# Patient Record
Sex: Female | Born: 1965 | Race: Black or African American | Hispanic: No | Marital: Married | Smoking: Never smoker
Health system: Southern US, Community
[De-identification: ages and names within clinical notes are randomized; demographics above are authoritative.]

## PROBLEM LIST (undated history)

## (undated) DIAGNOSIS — E269 Hyperaldosteronism, unspecified: Secondary | ICD-10-CM

## (undated) DIAGNOSIS — I10 Essential (primary) hypertension: Secondary | ICD-10-CM

## (undated) DIAGNOSIS — Z8669 Personal history of other diseases of the nervous system and sense organs: Secondary | ICD-10-CM

## (undated) DIAGNOSIS — D573 Sickle-cell trait: Secondary | ICD-10-CM

## (undated) HISTORY — DX: Essential (primary) hypertension: I10

## (undated) HISTORY — PX: WISDOM TOOTH EXTRACTION: SHX21

## (undated) HISTORY — DX: Hyperaldosteronism, unspecified: E26.9

## (undated) HISTORY — DX: Sickle-cell trait: D57.3

## (undated) HISTORY — DX: Personal history of other diseases of the nervous system and sense organs: Z86.69

---

## 2002-06-17 ENCOUNTER — Other Ambulatory Visit: Admission: RE | Admit: 2002-06-17 | Discharge: 2002-06-17 | Payer: Self-pay | Admitting: Obstetrics and Gynecology

## 2002-06-24 ENCOUNTER — Encounter: Payer: Self-pay | Admitting: Obstetrics and Gynecology

## 2002-06-24 ENCOUNTER — Ambulatory Visit (HOSPITAL_COMMUNITY): Admission: RE | Admit: 2002-06-24 | Discharge: 2002-06-24 | Payer: Self-pay | Admitting: Obstetrics and Gynecology

## 2004-09-28 ENCOUNTER — Encounter: Admission: RE | Admit: 2004-09-28 | Discharge: 2004-09-28 | Payer: Self-pay | Admitting: Allergy and Immunology

## 2004-12-18 ENCOUNTER — Ambulatory Visit (HOSPITAL_COMMUNITY): Admission: RE | Admit: 2004-12-18 | Discharge: 2004-12-18 | Payer: Self-pay | Admitting: Family Medicine

## 2006-01-15 ENCOUNTER — Ambulatory Visit (HOSPITAL_COMMUNITY): Admission: RE | Admit: 2006-01-15 | Discharge: 2006-01-15 | Payer: Self-pay | Admitting: Family Medicine

## 2006-02-06 ENCOUNTER — Ambulatory Visit (HOSPITAL_COMMUNITY): Admission: RE | Admit: 2006-02-06 | Discharge: 2006-02-06 | Payer: Self-pay | Admitting: Family Medicine

## 2007-04-07 ENCOUNTER — Ambulatory Visit (HOSPITAL_BASED_OUTPATIENT_CLINIC_OR_DEPARTMENT_OTHER): Admission: RE | Admit: 2007-04-07 | Discharge: 2007-04-07 | Payer: Self-pay | Admitting: Family Medicine

## 2007-04-12 ENCOUNTER — Ambulatory Visit: Payer: Self-pay | Admitting: Internal Medicine

## 2008-09-20 ENCOUNTER — Ambulatory Visit (HOSPITAL_COMMUNITY): Admission: RE | Admit: 2008-09-20 | Discharge: 2008-09-20 | Payer: Self-pay | Admitting: Family Medicine

## 2009-04-21 ENCOUNTER — Emergency Department (HOSPITAL_COMMUNITY): Admission: EM | Admit: 2009-04-21 | Discharge: 2009-04-21 | Payer: Self-pay | Admitting: Emergency Medicine

## 2010-01-04 ENCOUNTER — Ambulatory Visit (HOSPITAL_COMMUNITY)
Admission: RE | Admit: 2010-01-04 | Discharge: 2010-01-04 | Payer: Self-pay | Source: Home / Self Care | Attending: Obstetrics and Gynecology | Admitting: Obstetrics and Gynecology

## 2010-04-11 LAB — URINE MICROSCOPIC-ADD ON

## 2010-04-11 LAB — URINALYSIS, ROUTINE W REFLEX MICROSCOPIC
Glucose, UA: NEGATIVE mg/dL
Ketones, ur: 15 mg/dL — AB
Nitrite: POSITIVE — AB
Protein, ur: 100 mg/dL — AB
Specific Gravity, Urine: 1.015 (ref 1.005–1.030)
Urobilinogen, UA: 1 mg/dL (ref 0.0–1.0)
pH: 5.5 (ref 5.0–8.0)

## 2010-04-11 LAB — POCT PREGNANCY, URINE: Preg Test, Ur: NEGATIVE

## 2010-06-05 NOTE — Procedures (Signed)
NAMEMarland Kitchen  Destiny Alvarez, Destiny Alvarez NO.:  [id number]   MEDICAL RECORD NO.:  [id number]          PATIENT TYPE:  OUT   LOCATION:  SLEEP CENTER                 FACILITY:  [facility]   PHYSICIAN:  Clinton D. Maple Hudson, MD, FCCP, FACPDATE OF BIRTH:  05-17-64   DATE OF STUDY:  04/07/2007                            NOCTURNAL POLYSOMNOGRAM   REFERRING PHYSICIAN:  Betti D. Pecola Leisure, M.D.   REFERRING PHYSICIAN:  Dr. Leilani Able.   INDICATION FOR STUDY:  Hypersomnia with sleep apnea.   EPWORTH SLEEPINESS SCORE:  5/24   BMI 30.6, weight 245 pounds, height 75 inches (please verify).   HOME MEDICATIONS:  Charted and reviewed.   SLEEP ARCHITECTURE:  Total sleep time 334 minutes with sleep efficiency  91.3%; stage I was 5.2%, stage II 68.3%, stage III absent, REM 26.5% of  total sleep time.  Sleep latency 16 minutes, REM latency 54 minutes.  Awake after sleep onset 15.5 minutes.  Arousal index 9.7.  No bedtime  medication was taken.   RESPIRATORY DATA:  Apnea-hypopnea index (AHI) 1.1, respiratory  disturbance index (RDI) 5.4.  A total of 6 events were scored including  1 central apnea and 5 hypopneas with most events occurring while supine.  REM AHI 1.4.  There were insufficient events to qualify for CPAP  titration by split protocol.   OXYGEN DATA:  Mild to moderate snoring with oxygen desaturation to a  nadir of 91%.  Mean oxygen saturation through the study was 95.7% on  room air.   CARDIAC DATA:  Sinus rhythm with occasional PAC.   MOVEMENT/PARASOMNIA:  No significant movement disturbance, no bathroom  trips.   IMPRESSIONS/RECOMMENDATIONS:  1. Insignificant respiratory disturbance with occasional respiratory      events; apnea-hypopnea index 1.1 per hour is normal (normal range 0-      5).  Respiratory disturbance index of 5.4 is borderline, qualifying      for definition as minimal obstructive sleep apnea/hypopnea, but is      unlikely to be clinically significant.  Mild to moderate  snoring      with normal oxygenation, desaturating to a nadir of 91%.  2. Sleep architecture unremarkable.  The percentage of time spent in      rapid eye movement is a little higher than we usually see in an      adult in an unfamiliar environment.  If there are profound      complaints of daytime somnolence, consider exploring the      possibility of a primary disorder of excessive sleepiness such as      idiopathic      or narcolepsy.  If this is a real concern, consider returning for a      daytime sleep study (multiple sleep latency tests) to objectively      assess the daytime sleepiness.      Clinton D. Maple Hudson, MD, [facility] FACP  Diplomate, Biomedical engineer of Sleep Medicine  Electronically Signed     CDY/MEDQ  D:  04/12/2007 08:24:55  T:  04/12/2007 08:47:20  Job:  [id number]

## 2011-03-07 ENCOUNTER — Ambulatory Visit (INDEPENDENT_AMBULATORY_CARE_PROVIDER_SITE_OTHER): Payer: BC Managed Care – PPO | Admitting: Family Medicine

## 2011-03-07 VITALS — BP 152/90 | HR 86 | Temp 98.6°F | Resp 20 | Ht 74.5 in | Wt 244.0 lb

## 2011-03-07 DIAGNOSIS — J069 Acute upper respiratory infection, unspecified: Secondary | ICD-10-CM

## 2011-03-07 DIAGNOSIS — R05 Cough: Secondary | ICD-10-CM

## 2011-03-07 MED ORDER — BENZONATATE 100 MG PO CAPS: 100.0000 mg | ORAL_CAPSULE | Freq: Three times a day (TID) | ORAL | Status: AC | PRN

## 2011-03-07 MED ORDER — AZITHROMYCIN 250 MG PO TABS: ORAL_TABLET | ORAL | Status: AC

## 2011-03-07 NOTE — Patient Instructions (Signed)
Mucinex or Mucinex DM as needed for cough.  Fluids, rest as needed, and if not improving in next few days, fill antibiotic prescription.  Ok to take probiotics. Return to the clinic or go to the nearest emergency room if any of your symptoms worsen or new symptoms occur.

## 2011-03-07 NOTE — Progress Notes (Signed)
  Subjective:    Patient ID: Destiny Alvarez, female    DOB: [birth year], 46 y.o.   MRN: [id number]  HPI Destiny Alvarez is a 46 y.o. female Started 5-6 days ago - tickle in throat, nasal congestion 4 days ago, cough and chest congestion past 2 days.  Post tussive emesis x2.  No other N/V.  Coughing worst sx now.  Headache with cough.  No known recent sick contacts, but has been working out more recently at gym.   No flu shot this year.  No fevers.  Hx of pna and bronchitis in past. Tx: otc coricidin HBP, echinacea an goldenseal, vit C    Review of Systems  Constitutional: Positive for activity change. Negative for fever and chills.       Some fatigue  W/ illness  HENT: Positive for congestion, sore throat, rhinorrhea and postnasal drip. Negative for hearing loss, ear pain, sneezing, sinus pressure and ear discharge.   Respiratory: Positive for cough. Negative for shortness of breath and wheezing.        Productive - light green.  Neurological: Positive for headaches.       Objective:   Physical Exam  Constitutional: She is oriented to person, place, and time. She appears well-developed and well-nourished.  HENT:  Head: Normocephalic and atraumatic.  Right Ear: External ear normal.  Left Ear: External ear normal.  Nose: Nose normal.  Mouth/Throat: Oropharynx is clear and moist. No oropharyngeal exudate.  Eyes: Conjunctivae are normal. Pupils are equal, round, and reactive to light.  Neck: Neck supple.  Cardiovascular: Normal rate, regular rhythm, normal heart sounds and intact distal pulses.   Pulmonary/Chest: Effort normal and breath sounds normal. No respiratory distress. She has no wheezes. She has no rales. She exhibits no tenderness.  Lymphadenopathy:    She has no cervical adenopathy.  Neurological: She is alert and oriented to person, place, and time.  Skin: Skin is warm and dry. No rash noted.  Psychiatric: She has a normal mood and affect. Her behavior is normal.         Assessment & Plan:   1. Cough   2. URI (upper respiratory infection)    Suspect viral etiology still at this point.  Mucinex, saline ns, +/- tessalon. Zpak #1 if not improving in few days.   Return to the clinic or go to the nearest emergency room if symptoms worsen or new symptoms occur.

## 2011-07-24 DIAGNOSIS — G43009 Migraine without aura, not intractable, without status migrainosus: Secondary | ICD-10-CM | POA: Insufficient documentation

## 2011-09-05 DIAGNOSIS — E269 Hyperaldosteronism, unspecified: Secondary | ICD-10-CM | POA: Insufficient documentation

## 2011-09-05 DIAGNOSIS — D573 Sickle-cell trait: Secondary | ICD-10-CM | POA: Insufficient documentation

## 2011-09-05 DIAGNOSIS — I10 Essential (primary) hypertension: Secondary | ICD-10-CM | POA: Insufficient documentation

## 2013-09-13 ENCOUNTER — Emergency Department
Admission: EM | Admit: 2013-09-13 | Discharge: 2013-09-13 | Disposition: A | Discharge status: Home / Self Care | Payer: BC Managed Care – PPO | Attending: Emergency Medicine | Admitting: Emergency Medicine

## 2013-09-13 ENCOUNTER — Emergency Department: Payer: BC Managed Care – PPO

## 2013-09-13 DIAGNOSIS — E269 Hyperaldosteronism, unspecified: Secondary | ICD-10-CM | POA: Insufficient documentation

## 2013-09-13 DIAGNOSIS — S92911D Unspecified fracture of right toe(s), subsequent encounter for fracture with routine healing: Secondary | ICD-10-CM

## 2013-09-13 DIAGNOSIS — S92919A Unspecified fracture of unspecified toe(s), initial encounter for closed fracture: Secondary | ICD-10-CM | POA: Insufficient documentation

## 2013-09-13 DIAGNOSIS — R1012 Left upper quadrant pain: Secondary | ICD-10-CM | POA: Insufficient documentation

## 2013-09-13 DIAGNOSIS — I10 Essential (primary) hypertension: Secondary | ICD-10-CM | POA: Insufficient documentation

## 2013-09-13 LAB — URINALYSIS, REFLEX TO MICROSCOPIC EXAM IF INDICATED
Bilirubin, UA: NEGATIVE
Blood, UA: NEGATIVE
Glucose, UA: NEGATIVE
Ketones UA: NEGATIVE
Nitrite, UA: NEGATIVE
Specific Gravity UA: 1.014 (ref 1.001–1.035)
Urine pH: 6.5 (ref 5.0–8.0)
Urobilinogen, UA: NORMAL mg/dL

## 2013-09-13 LAB — POCT PREGNANCY TEST, URINE HCG: POCT Pregnancy HCG Test, UR: NEGATIVE

## 2013-09-13 NOTE — ED Provider Notes (Signed)
Physician/Midlevel provider first contact with patient: 09/13/13 1632          [facility] EMERGENCY DEPARTMENT H&P         CLINICAL SUMMARY          Diagnosis:    .     Final diagnoses:   Left upper quadrant pain   Toe fracture, right, with routine healing, subsequent encounter         MDM Notes:    MDM, Differential Diagnosis (not completely inclusive) and Plan: LUQ/left flank pain, intermittent, currently pain free.  Possible kidney stone, pyelo, mass.  Will check UA and non con CT.  Also past history of toe fx with continued pain, will recheck xray           Disposition:      ED Disposition     Discharge [facility] discharge to home/self care.    Condition at disposition: Stable            New Prescriptions    No medications on file                 CLINICAL INFORMATION        HPI:      Chief Complaint: Back Pain; Toe Pain; and Abdominal Pain  .    Alexis Whitehead is a 48 y.o. female      History obtained from: Patient    Symptoms have worsened in past 24-48 hours: yes    Location: LUQ and bilateral lower back  Quality: sharp  Severity: mild  Duration: 1-2 months  Timing: intermittent  Context: N/A  Modifying Factors: exacerbated with "crouching" while driving  Associated signs or symptoms: intermittent episodes of N/V where she will have "violent" emesis 4-5 times    PMD   Md, Out Of Network      ROS:      -Documented in HPI. Otherwise all other systems reviewed and negative.   -Nursing notes reviewed by me.         Physical Exam:      PE   BP: 165/88 mmHg, Temp: 98.1 F (36.7 C), Heart Rate: 77, Resp Rate: 16, SpO2: 95 %, Height: 190.5 cm, Weight: 115.667 kg    Pulse Oximetry Analysis -95 % on room air; Normal  Vitals reviewed   GEN: . Nontoxic, Well appearing, NAD.          Head: NC/AT.        Eyes: EOMI w/o pain, nl conjunctiva. NO d/c.         ENT: MMM, symmetric OP, no drooling/trismus.          Neck: FROM, supple, no masses, no tenderness.         Chest: ctab, NO resp distress. Equal chest rise.           CV: rrr, NO significant murmur appreciated.          Abd: no peritoneal signs, soft, benign, no distention, no tenderness             Back: NO CVAt, NO midline ttp.         UpperExt: NO deformity. FROM, neurovasc intact.         LowerExt: NO edema. FROM, neurovasc intact.          Neuro: moving all ext equally, no motor deficits, normal gait.         Skin: Warm and dry. NO rash.          Psych: Normal  affect. Normal insight.                      PAST HISTORY        Primary Care Provider: Md, Out Of Network        PMH/PSH:    .     Past Medical History   Diagnosis Date   . Hypertension    . Hyperaldosteronism        She has no past surgical history on file.      Social/Family History:      She reports that she has never smoked. She does not have any smokeless tobacco history on file. She reports that she does not drink alcohol or use illicit drugs.    History reviewed. No pertinent family history.      Listed Medications on Arrival:    .     Previous Medications    AMLODIPINE BESYLATE PO    Take by mouth.      Allergies: She is allergic to losartan.            VISIT INFORMATION        Clinical Course in the ED:            Medications Given in the ED:    .     ED Medication Orders     None            Procedures:      Procedures      Interpretations:      EKG: none     Cardiac monitor interpretation by me, Dr. Cristino Degroff:none  Radiologic Studies Interpreted (viewed) by me, Dr. Salley Scarlet: xr foot +toe fx.  CT abd pel- normal CT  Laboratory results reviewed by EDP: YES   Radiologic study results reviewed by EDP: YES               RESULTS        Lab Results:      Results     Procedure Component Value Units Date/Time    UA, Reflex to Microscopic [[id number]]  (Abnormal) Collected:  09/13/13 1911    Specimen Information:  Urine Updated:  09/13/13 1949     Urine Type Clean Catch      Color, UA Yellow      Clarity, UA Clear      Specific Gravity UA 1.014      Urine pH 6.5      Leukocyte Esterase, UA Small (A)       Nitrite, UA Negative      Protein, UR Trace (A)      Glucose, UA Negative      Ketones UA Negative      Urobilinogen, UA Normal mg/dL      Bilirubin, UA Negative      Blood, UA Negative      RBC, UA 0 - 5 /hpf      WBC, UA 6 - 10 (A) /hpf      Squamous Epithelial Cells, Urine 6 - 10 /hpf      Urine Bacteria Rare /hpf     Urine HCG, POC/ Qualitative [[id number]] Collected:  09/13/13 1916    Specimen Information:  Urine Updated:  09/13/13 1916     POCT QC Pass      POCT Pregnancy HCG Test, UR Negative      Comment:        Result:        Negative Value is Normal in  Healthy Males or Healthy non-pregnant Females              Radiology Results:      CT Abd/Pelvis without Contrast   Final Result   HISTORY: Left flank/upper quadrant pain      TECHNIQUE: Noncontrast CT abdomen and pelvis      FINDINGS: Evaluation is limited without contrast. Kidneys are normal in   size, contour and position. No urolithiasis, hydronephrosis or   perinephric stranding. The ureters are nondilated. The bladder is   nondistended.      Liver and spleen are normal in size and contour. The adrenals and   pancreas are grossly unremarkable. Gallbladder is contracted No biliary   ductal dilation, lymphadenopathy, hernia or free fluid. Bowel is   nondilated. Appendix is normal. No inflammatory process demonstrated.   Aortic caliber is normal.       Uterus and adnexal regions are grossly unremarkable. Soft tissues are   unremarkable. No suspicious bony lesion. Lung bases are clear.      IMPRESSION:     No detectable acute or suspicious findings.      Bea Laura, MD    09/13/2013 7:43 PM         Toes Right 2 + Views   Final Result      CLINICAL HISTORY: Right great toe pain after injury      Frontal, lateral and oblique views  of the right great toe were   submitted. There is a cortical irregularity involving the base of the   right first distal phalanx on the lateral aspect. This raises concern   for a nondisplaced fracture. There is associated soft  tissue swelling.   No dislocation. Mild spurring is noted at the first MTP joint.      IMPRESSION:       Possible fracture at the base of the right first distal phalanx on the   lateral aspect.      Latanya Maudlin, MD    09/13/2013 5:01 PM                     Scribe Attestation:      Attestations:  I was acting as a Neurosurgeon for Judi Saa, MD on Megan Mans  Treatment Team: Scribe: Particia Lather     I am the first provider for this patient and I personally performed the services documented. Treatment Team: Scribe: Particia Lather is scribing for me on Delduca,Aliha. This note accurately reflects work and decisions made by me.  Judi Saa, MD         Noralyn Pick, MD     Judi Saa, MD  09/13/13 [phone]

## 2013-09-13 NOTE — ED Notes (Signed)
Triage MD Note:  This patient has been seen by me or placed for labs/radiology studies per request of the Triage RN staff. I am not the primary provider.  Mcarthur Rossetti Graig Hessling 4:14 PM    Right great toe injury 1 month ago.  Known Fx but has been WB +  Also has had LBP today    Vara Guardian, MD  09/13/13 1616

## 2013-09-13 NOTE — ED Provider Notes (Signed)
09/13/2013     I did not participate in the documentation of Felicia Bloomquist.     Abram Sander   ED Scribe        09/13/2013     I did not participate in the documentation of Shantae Vantol.     Normand Sloop  ED PA-C        Judi Saa, MD  09/30/13 [phone]

## 2013-12-27 ENCOUNTER — Emergency Department
Admission: EM | Admit: 2013-12-27 | Discharge: 2013-12-27 | Disposition: A | Discharge status: Home / Self Care | Payer: BC Managed Care – PPO | Attending: Emergency Medicine | Admitting: Emergency Medicine

## 2013-12-27 ENCOUNTER — Emergency Department: Payer: BC Managed Care – PPO

## 2013-12-27 ENCOUNTER — Other Ambulatory Visit: Payer: Self-pay

## 2013-12-27 DIAGNOSIS — I10 Essential (primary) hypertension: Secondary | ICD-10-CM | POA: Insufficient documentation

## 2013-12-27 DIAGNOSIS — E269 Hyperaldosteronism, unspecified: Secondary | ICD-10-CM | POA: Insufficient documentation

## 2013-12-27 DIAGNOSIS — L03115 Cellulitis of right lower limb: Secondary | ICD-10-CM | POA: Insufficient documentation

## 2013-12-27 MED ORDER — HYDROCODONE-ACETAMINOPHEN 10-325 MG PO TABS
1.0000 | ORAL_TABLET | Freq: Four times a day (QID) | ORAL | 0 refills | Status: DC | PRN
Start: 2013-12-27 — End: 2017-05-08
  Filled 2013-12-27: qty 10, 3d supply, fill #0

## 2013-12-27 MED ORDER — SODIUM CHLORIDE 0.9 % IV MBP
1.0000 g | Freq: Once | INTRAVENOUS | Status: AC
Start: 2013-12-27 — End: 2013-12-27
  Administered 2013-12-27: 1 g via INTRAVENOUS
  Filled 2013-12-27: qty 1000

## 2013-12-27 MED ORDER — SULFAMETHOXAZOLE-TRIMETHOPRIM 800-160 MG PO TABS
2.0000 | ORAL_TABLET | Freq: Two times a day (BID) | ORAL | 0 refills | Status: AC
Start: 2013-12-27 — End: 2014-01-03
  Filled 2013-12-27: qty 28, 7d supply, fill #0

## 2013-12-27 NOTE — ED Provider Notes (Signed)
[facility] EMERGENCY DEPARTMENT APP H&P         CLINICAL SUMMARY          Diagnosis:    .     Final diagnoses:   Cellulitis of right lower extremity         MDM Notes:      Cellulitis of RLE posteriorly. Long car ride ~14 hours 1 week ago. Sono negative. IV abx administered in the ED. Rx bactrim. F/u with Dr. Sharolyn Douglas (Plastics)      Disposition:         Discharge          New Prescriptions    No medications on file                 CLINICAL INFORMATION        HPI:      Chief Complaint: Rash  .     Alexis Whitehead is a 48 y.o. female with a PMHx of HTN, hyperaldosteronism presents c/o progressively worsening painful rash on the back of the right leg for the past 4 days. Pt was seen at urgent care 3 days ago for sxs, started on Clinda. Pt believes sxs have worsened which prompted ED visit. She notes a hx of similar sxs several months ago on left hip, started on abx and resolved. LMP 8 days ago. Also noted a 7 hour car ride to and from [place] last week. Denies fevers, drainage, CP, SOB, nausea, vomiting.     History obtained from: Patient      ROS:      Positive and negative ROS elements as per HPI.      Physical Exam:      Pulse 65  BP (!) 159/104 mmHg  Resp 17  SpO2 97 %  Temp 98.2 F (36.8 C)    Physical Exam   Constitutional: She is oriented to person, place, and time. She appears well-developed and well-nourished. No distress.   HENT:   Head: Normocephalic and atraumatic.   Eyes: Conjunctivae are normal. Pupils are equal, round, and reactive to light.   Cardiovascular: Normal rate, regular rhythm and normal heart sounds.    No murmur heard.  Pulmonary/Chest: Effort normal and breath sounds normal. No respiratory distress.   Abdominal: Soft. Bowel sounds are normal. She exhibits no distension. There is no tenderness. There is no rebound and no guarding.   Musculoskeletal: Normal range of motion. She exhibits edema and tenderness.   Neurological: She is alert and oriented to person, place,  and time. No cranial nerve deficit.   Skin: Skin is warm and dry. No rash noted. There is erythema.   Right lower extremity 10cm diameter area of erythema with central area of fluctuance. Increased warmth. Generalized swelling of lower right leg with swelling around right ankle and foot. Right knee normal. Distal pulses intact. Calf malleable but tender.   Psychiatric: She has a normal mood and affect. Her behavior is normal.   Nursing note and vitals reviewed.                PAST HISTORY        Primary Care Provider: Md, Out Of Network        PMH/PSH:    .     Past Medical History   Diagnosis Date   . Hypertension    . Hyperaldosteronism        She has no past surgical history on file.      Social/Family History:  She reports that she has never smoked. She does not have any smokeless tobacco history on file. She reports that she does not drink alcohol or use illicit drugs.    History reviewed. No pertinent family history.      Listed Medications on Arrival:    .     Previous Medications    AMLODIPINE BESYLATE PO    Take by mouth.    CLINDAMYCIN HCL PO    Take by mouth.    EPLERENONE PO    Take by mouth.      Allergies: She is allergic to losartan.            VISIT INFORMATION        Clinical Course in the ED:            Medications Given in the ED:    .     ED Medication Orders     Start     Status Ordering Provider    12/27/13 1640  cefTRIAXone (ROCEPHIN) 1 g in sodium chloride 0.9 % 100 mL IVPB mini-bag plus   Once     Route: Intravenous  Ordered Dose: 1 g     Last MAR action:  New Bag Sister Carbone M            Procedures:      Incision/Drainage  Date/Time: 12/27/2013 4:27 PM  Performed by: Lacey Jensen  Authorized by: Lacey Jensen    Consent:     Consent obtained:  Verbal    Consent given by:  Patient    Risks discussed:  Bleeding, incomplete drainage and infection    Alternatives discussed:  No treatment, delayed treatment and alternative treatment  Location:     Type:  Abscess    Size:   10cm diameter     Location:  Lower extremity    Lower extremity location:  R leg  Pre-procedure details:     Skin preparation:  Betadine, antiseptic wash and Hibiclens  Anesthesia (see MAR for exact dosages):     Anesthesia method:  Local infiltration    Local anesthetic:  Lidocaine 1% WITH epi  Procedure details:     Complexity:  Simple    Incision types:  Stab incision    Incision depth:  Subcutaneous    Scalpel blade:  10    Drainage:  Bloody    Drainage amount:  Scant    Wound treatment:  Wound left open  Post-procedure details:     Patient tolerance of procedure:  Tolerated well, no immediate complications        Interpretations:                   RESULTS        Lab Results:      Results     Procedure Component Value Units Date/Time    Wound Culture & Gram Stain [[id number]] Collected:  12/27/13 1641    Specimen Information:  Wound / Abscess Updated:  12/27/13 1641              Radiology Results:      [place] VenoDopp Low Extremity Right   Final Result      1. No evidence for DVT.   2. 1.2 cm mid right posterior calf subcutaneous fluid collection,   liquefied hematoma versus abscess.            Charlott Rakes, MD    12/27/2013 4:11 PM  Scribe Attestation:      I was acting as a Neurosurgeon for [employer], PA-C on Verdell Face, [place]   I am the first provider for this patient and I personally performed the services documented. Joni Reining is scribing for me on Whitehead,Alexis. This note accurately reflects work and decisions made by me.  [address] PA-C                             Normand Sloop [place], [name]  04/11/14 1040

## 2013-12-27 NOTE — ED Provider Notes (Signed)
The patient was seen and examined by the mid-level (physician's assistant or nurse practitioner), or fellow, and the plan of care was discussed with me. I agree with the plan as it was presented to me.   Alexis Whitehead, Alexis Ly, MD           48 y.o. female presents for worsening cellulitis to her right leg for 4 days, started on Clinda by urgent care 3 days ago, symptoms reportedly not improving. Patient states 7 hour car trip 1 week ago, denies pain or swelling to leg. No fevers.  [place] no DVT but likely small abscess.  I&D attempted without drainage of purulent material - will change abx to 2 tab bactrim BID and recommend follow up with surgery or plastics for deeper I&D.                                                      I was acting as a scribe for Alexis Whitehead, Alexis Ly, MD on Alexis Whitehead,Alexis Whitehead  Treatment Team: Scribe: Alexis Whitehead; Scribe: Alexis Whitehead   I am the first attending provider for this patient and I personally performed the services documented. Treatment Team: Scribe: Alexis Whitehead; Scribe: Alexis Whitehead is scribing for me on Alexis Whitehead,Alexis Whitehead. This note accurately reflects work and decisions made by me.  Alexis Whitehead, Alexis Ly, MD        Alexis Whitehead, Alexis Ly, MD  12/27/13 [phone]

## 2013-12-27 NOTE — Discharge Instructions (Signed)
Alexis Whitehead  [id number]  [id number]  [id number]  12/27/2013    Discharge Instructions    As always, you are the most important factor in your recovery.  Please follow these instructions carefully.  If you have problems that we have not discussed, CALL OR VISIT YOUR DOCTOR RIGHT AWAY.     If you can't reach your doctor, return to the emergency department.    I Megan Mans understand the written and discussed instructions.  My questions have been answered.  I acknowledge receipt of these instructions.     Patient or responsible person:         Patient's Signature               Physician or Nurse              Take your antibiotics as prescribed.     Follow up with Dr. Sharolyn Douglas. Contact information has been provided.     Cellulitis    You were diagnosed with cellulitis.    This is a bacterial infection of the skin. Symptoms are usually redness, swelling, and warmth in the affected area. Some people get a fever (temperature higher than 100.47F / 38C) with this infection.    Keep the extremity (arm or leg) above your heart level if possible.    Cellulitis is treated with antibiotics. It is also treated by keeping the affected area elevated (up). Sometimes, antibiotics are given intravenously ("IV"). Other infections can be treated with oral (by mouth) medicines.    Redness, swelling, warmth, and fever should start to get better after 2-3 days of treatment. Come back here or go to the nearest Emergency Department or your primary doctor for a re-check as directed.    YOU SHOULD SEEK MEDICAL ATTENTION IMMEDIATELY, EITHER HERE OR AT THE NEAREST EMERGENCY DEPARTMENT, IF ANY OF THE FOLLOWING OCCURS:   Redness spreads even with treatment. You can mark the infection area with a pen. This will help watch for improvement or spreading.   Fever (temperature higher than 100.47F / 38C) doesn t go away or gets worse after 2-3 days of antibiotics.   Unusual or increasing pain in the infected  area.   Lightheadedness.   Feeling sicker at any time or not getting better as expected.

## 2014-09-19 ENCOUNTER — Encounter (INDEPENDENT_AMBULATORY_CARE_PROVIDER_SITE_OTHER): Payer: Self-pay

## 2014-09-19 ENCOUNTER — Ambulatory Visit (INDEPENDENT_AMBULATORY_CARE_PROVIDER_SITE_OTHER): Payer: BC Managed Care – PPO | Admitting: Emergency Medicine

## 2014-09-19 VITALS — BP 174/100 | HR 98 | Temp 98.6°F | Resp 18 | Ht 75.0 in | Wt 260.0 lb

## 2014-09-19 DIAGNOSIS — I160 Hypertensive urgency: Secondary | ICD-10-CM

## 2014-09-19 DIAGNOSIS — I10 Essential (primary) hypertension: Secondary | ICD-10-CM

## 2014-09-19 DIAGNOSIS — R079 Chest pain, unspecified: Secondary | ICD-10-CM

## 2014-09-19 NOTE — Patient Instructions (Signed)
Chest Pain, Uncertain Cause  Chest pain can happen for a number of reasons. Sometimes the cause can not be determined. If yourcondition does not seem serious, and your pain does not appear to be coming from your heart, your doctor may recommend watching it closely. Sometimes the signs of a serious problem take more time to appear. Therefore, watch for the warning signs listed below.  Home care  After your visit, follow these recommendations:   Rest today and avoid strenuous activity.   Take any prescribed medicine as directed.  Follow-up care  Follow up with your doctor or this facility as instructed or if you do not start to feel better within 24 hours.  Call 911  Get immediate medical attention if any of the following occur:   A change in the type of pain: if it feels different, becomes more severe, lasts longer, or begins to spread into your shoulder, arm, neck, jaw or back   Shortness of breath or increased pain with breathing   Weakness, dizziness, or fainting   Rapid heart beat  Get prompt medical attention  Call your doctor right away if any of the following occur:   Cough with dark colored sputum (phlegm) or blood   Fever of 100.74F(38C) or higher, or as directed by your health care provider   Swelling, pain or redness in one leg   2000-2015 [employer] [address], [place], [name] [address]. All rights reserved. This information is not intended as a substitute for professional medical care. Always follow your healthcare professional's instructions.        Discharge Instructions for Malignant Hypertension  Malignant hypertension is a medical emergency.It means you have dangerouslyhigh blood pressure (a top number usually higher than 220 or a bottom number higher than 120). This elevated blood pressure could result in damage to your heart, kidneys, brain, eyes, blood vessels, and other organs. Here's what you can do to help manage this condition.  Taking your blood  pressure   Learn to take your own blood pressure.   Keep a record of your blood pressure results. Ask your doctor which readings mean that you need medical attention.   Have your blood pressure checked by your health care provider regularly.   Call your doctor if you have a blood pressure measure at home that is higher than180/110.  Taking medications   Take your blood pressure medication exactly as your doctor directed.   Learn the possible side effects of any prescribed medications.   Tell your doctor about any medications you are taking. Some medications can cause malignant hypertension.   Avoid medications that contain heart stimulants, including over-the-counter drugs. Check for warnings about high blood pressure on the label.   Check with your doctor before taking a decongestant. Some can worsen high blood pressure.  Lifestyle changes   Limit your activity until your blood pressure is controlled.   Cut back on salt.   Limit canned, dried, packaged, and fast foods.   Don't add salt to your food at the table.   Season foods with herbs instead of salt when you cook.   Maintain a healthy weight. Get help to lose any extra pounds.   Begin an exercise program. Ask your doctor how to get started. You can benefit from simple activities like walking or gardening.   Don't drink more than 1 alcoholic drink a day for women and 2 a day for men.   Limit drinks that contain caffeine (coffee, black  or green tea, cola) to 2 per day.   Never take stimulants such as amphetamines or cocaine; these drugs can be deadly for someone with hypertension.   Control your stress. Learn stress-management techniques.  Follow-up care   Make a follow-up appointment with your doctor regularly.   Visit your doctor for blood pressure checks, dietary advice, and medication adjustment.     [address] [employer] [address] [place], [name] [address]. All rights reserved. This information is not intended  as a substitute for professional medical care. Always follow your healthcare professional's instructions.    Please go to the ER to be evaluated   You might need imaging to rule out a bleed  Blood pressure control  Labs work to evaluate you heart and kidneys

## 2014-09-19 NOTE — Progress Notes (Signed)
Subjective:       Patient ID: Alexis Whitehead is a 49 y.o. female.    HPI  Chief Complaint   Patient presents with   . Headache     Patient states that shes been having a headache. She states that she hasnt taken anything for the headache.     pt is a 49yo female with pmh of hyperaldosteronism who presents with cc of headache. Pulsating, sharp pain, constant sometimes intermittent. Headache for one week worse the past two days, frontal and temple ares. Pt states that she has been under a lot of stress lately, she gained weight and has a lot of stuff going on with work and family life. Pt denies any blurry vision, no diplopia, no trauma or injury, no fever or chills, no blood thinner use, no numbness or tingling. Pt feels tired, occasional dizziness, no weakness. Pt is also complaining of dull achy in her chest, no radiation of pain, no sob.   The following portions of the patient's history were reviewed and updated as appropriate: allergies, current medications, past family history, past medical history, past social history, past surgical history and problem list.    Review of Systems   Constitutional: Negative.  Negative for fever and chills.   HENT: Negative for congestion, rhinorrhea, sinus pressure, sore throat and trouble swallowing.    Eyes: Negative.  Negative for photophobia and visual disturbance.   Respiratory: Negative.  Negative for chest tightness and shortness of breath.    Cardiovascular: Positive for chest pain. Negative for palpitations and leg swelling.   Musculoskeletal: Negative.    Neurological: Positive for headaches. Negative for dizziness and weakness.   Hematological: Negative.    Psychiatric/Behavioral: Negative for confusion and decreased concentration.   All other systems reviewed and are negative.          Objective:     Physical Exam   Nursing note and vitals reviewed.  Constitutional: She is oriented to person, place, and time. She appears well-developed and well-nourished. She appears  distressed.   HENT:   Head: Normocephalic and atraumatic.   Mouth/Throat: Oropharynx is clear and moist. No oropharyngeal exudate.   Eyes: Conjunctivae and EOM are normal. Pupils are equal, round, and reactive to light. No scleral icterus.   No papilledema   Neck: Normal range of motion. Neck supple. Thyromegaly present.   Cardiovascular: Normal rate, regular rhythm and normal heart sounds.    Pulmonary/Chest: Effort normal and breath sounds normal. No respiratory distress. She exhibits no tenderness.   Abdominal: Soft. Bowel sounds are normal. There is no tenderness.   Neurological: She is alert and oriented to person, place, and time. No cranial nerve deficit. She exhibits normal muscle tone. Coordination normal.   Normal finger to nose test             EKG Interpretation  EKG interpreted by EDP  Rate: Normal for age.  Rhythm: Regular  Axis: Normal for age  PR, QRS and QT intervals:  1st degree block  ST Segments: No deviations suggestive of ischemia  Impression: NSR with 1st degree block      Assessment:     hypertensive Urgency   Elevated bp with headache (new) and atypical chest pain  Blood pressure is elevated to 188/100  Repeat bp 174/106  Pt is also having chest pain   EKG done: reviewed      Plan:       Recommendations for transfer to ED for further evaluation and treatment. Recommendation  was based after reviewing patient's chart, exam, and history by patient. I discussed my concerns with patient.   Patient refused ambulance transfer.    I spoke with Dr. Rosanne Ashing, physician ED, about my concerns and reason for transfer.  Pt was discharged in stable condition.

## 2014-12-22 ENCOUNTER — Ambulatory Visit (INDEPENDENT_AMBULATORY_CARE_PROVIDER_SITE_OTHER): Payer: BC Managed Care – PPO | Admitting: Family Medicine

## 2014-12-22 ENCOUNTER — Encounter (INDEPENDENT_AMBULATORY_CARE_PROVIDER_SITE_OTHER): Payer: Self-pay | Admitting: Family Medicine

## 2014-12-22 VITALS — BP 162/106 | HR 76 | Temp 98.0°F | Ht 75.0 in | Wt 250.0 lb

## 2014-12-22 DIAGNOSIS — J209 Acute bronchitis, unspecified: Secondary | ICD-10-CM

## 2014-12-22 DIAGNOSIS — J040 Acute laryngitis: Secondary | ICD-10-CM

## 2014-12-22 MED ORDER — CEFDINIR 300 MG PO CAPS
600.0000 mg | ORAL_CAPSULE | Freq: Every day | ORAL | Status: AC
Start: 2014-12-22 — End: 2015-01-01

## 2014-12-22 NOTE — Progress Notes (Signed)
[facility]  PROGRESS NOTE     Patient: Alexis Whitehead   Date: 12/22/2014   MRN: [id number]       Alexis Whitehead is a 49 y.o. female      SUBJECTIVE     Chief Complaint   Patient presents with   . Cough     Pt here c/o of cough with chest congestion, pt also ha congestion onset 12/19/14            Cough  This is a new problem. The current episode started in the past 7 days. The problem has been gradually worsening. The cough is productive of purulent sputum. Associated symptoms include chills, ear pain, nasal congestion, postnasal drip and a sore throat. Pertinent negatives include no headaches, myalgias, rash, rhinorrhea, shortness of breath, sweats or wheezing. The symptoms are aggravated by cold air. She has tried OTC cough suppressant for the symptoms. The treatment provided no relief.       Review of Systems   Constitutional: Positive for chills.        Subjective fever     HENT: Positive for congestion, ear pain, postnasal drip, sinus pressure and sore throat. Negative for rhinorrhea.    Eyes: Negative for discharge and visual disturbance.   Respiratory: Positive for cough. Negative for apnea, choking, chest tightness, shortness of breath, wheezing and stridor.    Cardiovascular: Negative for palpitations.   Gastrointestinal: Negative for nausea and abdominal pain.   Genitourinary: Negative for dysuria.   Musculoskeletal: Negative for myalgias and arthralgias.   Skin: Negative for rash.   Neurological: Negative for dizziness, weakness and headaches.   Hematological: Does not bruise/bleed easily.   Psychiatric/Behavioral: The patient is not nervous/anxious.    All other systems reviewed and are negative.      The following portions of the patient's history were reviewed and updated as appropriate: Allergies, Current Medications, Past Family History, Past Medical history, Past social history, Past surgical history, and Problem List.    OBJECTIVE     Vitals   Filed Vitals:    12/22/14 1834   BP: 162/106    Pulse: 76   Temp: 98 F (36.7 C)   TempSrc: Oral   Height: 1.905 m (6\' 3" )   Weight: 113.399 kg (250 lb)   SpO2: 98%       Physical Exam   Nursing note and vitals reviewed.  Constitutional: She is oriented to person, place, and time. She appears well-developed and well-nourished. No distress.   HENT:   Head: Normocephalic and atraumatic.   Mouth/Throat: Oropharynx is clear and moist. No oropharyngeal exudate.   Eyes: Conjunctivae and EOM are normal. Pupils are equal, round, and reactive to light.   Neck: Normal range of motion. Neck supple.   Cardiovascular: Regular rhythm and normal heart sounds.  Exam reveals no gallop.    No murmur heard.  Pulmonary/Chest: Effort normal. No respiratory distress. She has wheezes. She has no rales. She exhibits no tenderness.     BILAT BS; Mild wheeze; clears with cough   Musculoskeletal: Normal range of motion.   Lymphadenopathy:     She has no cervical adenopathy.   Neurological: She is alert and oriented to person, place, and time. She has normal reflexes. She displays normal reflexes. No cranial nerve deficit. She exhibits normal muscle tone. Coordination normal.     CN II - XII intact   Skin: Skin is warm. No rash noted. She is not diaphoretic. No erythema. No pallor.  Psychiatric: She has a normal mood and affect. Her behavior is normal. Judgment and thought content normal.       Lab Results (24 Hour)   Results     ** No results found for the last 24 hours. **          Radiology Results (24 Hour)     ** No results found for the last 24 hours. **          ASSESSMENT     Encounter Diagnoses   Name Primary?   . Acute bronchitis, unspecified organism Yes   . Laryngitis           PLAN     Procedures    1. Acute bronchitis, unspecified organism  - cefdinir (OMNICEF) 300 MG capsule; Take 2 capsules (600 mg total) by mouth daily.  Dispense: 20 capsule; Refill: 0    2. Laryngitis    An After Visit Summary was printed and given to the patient.    Probiotics    Patient to follow up  with  [facility] primary care doctor or follow up here if symptoms worsening or not resolving as expected.   Patient verbalized understanding.   Reviewed discharge instructions that are included here with patient, and printed in AVS. Questions answered.        Signed,  Daneil Dan, MD  12/22/2014

## 2014-12-22 NOTE — Patient Instructions (Signed)
Acute Bronchitis  Your health care provider has told you that you have acute bronchitis. Bronchitis is infection or inflammation of the bronchial tubes (airways in the lungs). Normally, air moves easily in and out of the airways. Bronchitis narrows the airways, making it harder for air to flow in and out of the lungs. This causes symptoms such as shortness of breath, coughing, and wheezing. Bronchitis can be "acute" or "chronic." Acute means the condition comes on quickly and goes away in a short time. Chronic means a condition lasts a long time and often comes back. Read on to learn more about acute bronchitis.    What Causes Acute Bronchitis?  Acute bronchitis almost always starts as a viral respiratory infection, such as a cold or the flu. Certain factors make it more likely for a cold or flu to turn into bronchitis. These include being very young or very old or having a heart or lung problem. Cigarette smoking also makes bronchitis more likely.  When bronchitis develops, the airways become swollen. The airways may also become infected with bacteria. This is known as a secondary infection.  Diagnosing Acute Bronchitis  Your health care provider will examine you and ask about your symptoms and health history. You may also have a sputum culture to test the fluid in your lungs. Chest X-rays may be done to look for infection in the lungs.  Treating Acute Bronchitis  Bronchitis usually clears up as the cold or flu goes away. You can help feel better faster by doing the following:   Take medication as directed. You may be told to take ibuprofen or other over-the-counter medications. These help relieve inflammation in your bronchial tubes. Your doctor may prescribe an inhaler to help open up the bronchial tubes. Most of the time,acute bronchitisis caused by a viral infection. Antibiotics are usually not prescribed for viral infections.   Drink plenty of fluids, such as water, juice, or warm soup. Fluids loosen  mucus so that you can cough it up. This helps you breathe more easily. Fluids also prevent dehydration.   Make sure you get plenty of rest.   Do not smoke. Do not allow anyone else to smoke in your home.  Recovery and Follow-Up  Follow up with your doctor as you are told. You will likely feel better in a week or two. But a dry cough can linger beyond that time. Let your doctor know if you still have symptoms (other than a dry cough) after 2 weeks. If you're prone to getting bronchial infections, let your doctor know. And take steps to protect yourself from future infections. These steps include stopping smoking and avoiding tobacco smoke, washing your hands often, and getting a yearly flu shot.     [address] [employer] [address] [place], [name] [address]. All rights reserved. This information is not intended as a substitute for professional medical care. Always follow your healthcare professional's instructions.      PROBIOTICS    MUCINEX  DM  AS NEEDED

## 2015-06-03 ENCOUNTER — Encounter (INDEPENDENT_AMBULATORY_CARE_PROVIDER_SITE_OTHER): Payer: Self-pay | Admitting: Internal Medicine

## 2015-06-03 ENCOUNTER — Ambulatory Visit (INDEPENDENT_AMBULATORY_CARE_PROVIDER_SITE_OTHER): Payer: BC Managed Care – PPO | Admitting: Internal Medicine

## 2015-06-03 VITALS — BP 139/91 | HR 67 | Temp 99.0°F | Resp 18 | Ht 75.0 in | Wt 240.0 lb

## 2015-06-03 DIAGNOSIS — J209 Acute bronchitis, unspecified: Secondary | ICD-10-CM

## 2015-06-03 MED ORDER — IPRATROPIUM BROMIDE HFA 17 MCG/ACT IN AERS
2.0000 | INHALATION_SPRAY | Freq: Four times a day (QID) | RESPIRATORY_TRACT | Status: AC
Start: 2015-06-03 — End: 2016-06-02

## 2015-06-03 MED ORDER — AZITHROMYCIN 500 MG PO TABS
500.0000 mg | ORAL_TABLET | Freq: Every day | ORAL | Status: AC
Start: 2015-06-03 — End: 2015-06-08

## 2015-06-03 MED ORDER — ALBUTEROL-IPRATROPIUM 2.5-0.5 (3) MG/3ML IN SOLN
3.0000 mL | Freq: Once | RESPIRATORY_TRACT | Status: AC
Start: 2015-06-03 — End: 2015-06-03
  Administered 2015-06-03: 3 mL via RESPIRATORY_TRACT

## 2015-06-03 NOTE — Progress Notes (Signed)
[facility]  PROGRESS NOTE     Patient: Alexis Whitehead   Date: 06/03/2015   MRN: [id number]       Alexis Whitehead is a 50 y.o. female      SUBJECTIVE     Chief Complaint   Patient presents with   . Cough     Patient began with cough Thursday night (06/01/2015). Now patient c/o coughing fits, runny nose, and green mucous. No known fevers. Patient medicating with Zinc, tea, and Zzz-Quil to help sleep. SPO2 97% RA in clinic today.          Cough  This is a new problem. Episode onset: past 3 days. The cough is productive of purulent sputum. Associated symptoms include a sore throat. Pertinent negatives include no shortness of breath or wheezing. Associated symptoms comments: Nasal and sinus congestion, has sore throat. There is no history of asthma or COPD.       Review of Systems   Constitutional: Positive for activity change and appetite change.   HENT: Positive for nosebleeds, sinus pressure and sore throat.    Eyes: Negative.    Respiratory: Positive for cough. Negative for shortness of breath and wheezing.    Cardiovascular: Negative.    Gastrointestinal: Negative.    Neurological: Negative.        The following portions of the patient's history were reviewed and updated as appropriate: Allergies, Current Medications, Past Family History, Past Medical history, Past social history, Past surgical history, and Problem List.    OBJECTIVE     Vitals   Filed Vitals:    06/03/15 1132 06/03/15 1144   BP: 154/109 139/91   Pulse: 67    Temp: 99 F (37.2 C)    TempSrc: Oral    Resp: 18    Height: 1.905 m (6\' 3" )    Weight: 108.863 kg (240 lb)    SpO2: 97%        Physical Exam   Constitutional: She is oriented to person, place, and time. She appears well-developed.   Patient appears sick in no acute respiratory distress   Eyes: Conjunctivae are normal. Pupils are equal, round, and reactive to light.   Neck: Normal range of motion. Neck supple.   Cardiovascular: Normal rate and regular rhythm.    Pulmonary/Chest: Effort  normal. No respiratory distress. She has wheezes.   Abdominal: Soft. Bowel sounds are normal.   Neurological: She is alert and oriented to person, place, and time.       Lab Results (24 Hour)   Results     ** No results found for the last 24 hours. **          Radiology Results (24 Hour)     ** No results found for the last 24 hours. **          ASSESSMENT     Encounter Diagnosis   Name Primary?   . Acute bronchitis, unspecified organism Yes          PLAN     Procedures    1. Acute bronchitis, unspecified organism  - albuterol-ipratropium (DUO-NEB) 2.5-0.5(3) mg/3 mL nebulizer 3 mL; Take 3 mLs by nebulization one time.    Patient felt better after neb treatment, recommending steam, mucinex-DM, Zithromax course, may take bronchodilator as needed for wheezing and cough. Recommended a close follow up with her primary care doctor    An After Visit Summary was printed and given to the patient.      Signed,  Loretta Plume, MD  06/03/2015

## 2015-06-03 NOTE — Patient Instructions (Signed)
Bronchitis, Antibiotic Treatment (Adult)    Bronchitis is an infection of the air passages (bronchial tubes) in your lungs. It often occurs when you have a cold. This illness is contagious during the first few days and is spread through the air by coughing and sneezing, or by direct contact (touching the sick person and then touching your own eyes, nose, or mouth).  Symptoms of bronchitis include cough with mucus (phlegm) and low-grade fever. Bronchitis usually lasts 7 to 14 days. Mild cases can be treated with simple home remedies. More severe infection is treated with an antibiotic.  Home care  Follow these guidelines when caring for yourself at home:   If your symptoms are severe, rest at home for the first 2 to 3 days. When you go back to your usual activities, don't let yourself get too tired.   Do not smoke. Also avoid being exposed to secondhand smoke.   You may use over-the-counter medicines to control fever or pain, unless another medicine was prescribed. (Note: If you have chronic liver or kidney disease or have ever had a stomach ulcer or gastrointestinal bleeding, talk with your healthcare provider before using these medicines. Also talk to your provider if you are taking medicine to prevent blood clots.) Aspirin should never be given to anyone younger than 51 years of age who is ill with a viral infection or fever. It may cause severe liver or brain damage.   Your appetite may be poor, so a light diet is fine. Avoid dehydration by drinking 6 to 8 glasses of fluids per day (such as water, soft drinks, sports drinks, juices, tea, or soup). Extra fluids will help loosen secretions in the nose and lungs.   Over-the-counter cough, cold, and sore-throat medicines will not shorten the length of the illness, but they may be helpful to reduce symptoms. (Note: Do not use decongestants if you have high blood pressure.)   Finish all antibiotic medicine. Do this even if you are feeling better after only a  few days.  Follow-up care  Follow up with your healthcare provider, or as advised. If you had an X-ray or ECG (electrocardiogram), a specialist will review it. You will be notified of any new findings that may affect your care.  Note: If you are age 38 or older, or if you have a chronic lung disease or condition that affects your immune system, or you smoke, talk to your healthcare provider about having pneumococcal vaccinations and a yearly influenza vaccination (flu shot).  When to seek medical advice  Call your healthcare provider right away if any of these occur:   Fever of 100.45F (38C) or higher   Coughing up increased amounts of colored sputum   Weakness, drowsiness, headache, facial pain, ear pain, or a stiff neck  Call 911, or get immediate medical care  Contact emergency services right away if any of these occur.   Coughing up blood   Worsening weakness, drowsiness, headache, or stiff neck   Trouble breathing, wheezing, or pain with breathing  Date Last Reviewed: 10/03/2013   2000-2016 [employer] [address] [place], [name] [address]. All rights reserved. This information is not intended as a substitute for professional medical care. Always follow your healthcare professional's instructions.    Please take antibiotic course as prescribed, mucinex-DM 1200mg  twice daily, plenty of warm fluids, steam with vics vapour rub, take probiotics 3-4 hrs away from the antiobitics

## 2016-01-23 ENCOUNTER — Other Ambulatory Visit: Payer: Self-pay | Admitting: Nephrology

## 2016-01-23 DIAGNOSIS — Z1231 Encounter for screening mammogram for malignant neoplasm of breast: Secondary | ICD-10-CM

## 2016-01-24 ENCOUNTER — Ambulatory Visit
Admission: RE | Admit: 2016-01-24 | Discharge: 2016-01-24 | Disposition: A | Discharge status: Home / Self Care | Payer: BLUE CROSS/BLUE SHIELD | Source: Ambulatory Visit | Attending: Nephrology | Admitting: Nephrology

## 2016-01-24 DIAGNOSIS — Z1231 Encounter for screening mammogram for malignant neoplasm of breast: Secondary | ICD-10-CM

## 2018-01-06 ENCOUNTER — Encounter: Payer: Self-pay | Admitting: Physician Assistant

## 2018-01-06 ENCOUNTER — Ambulatory Visit: Payer: BLUE CROSS/BLUE SHIELD | Admitting: Physician Assistant

## 2018-01-06 VITALS — BP 140/88 | HR 63 | Temp 98.5°F | Ht 75.0 in | Wt 241.2 lb

## 2018-01-06 DIAGNOSIS — Z23 Encounter for immunization: Secondary | ICD-10-CM | POA: Diagnosis not present

## 2018-01-06 DIAGNOSIS — E269 Hyperaldosteronism, unspecified: Secondary | ICD-10-CM

## 2018-01-06 DIAGNOSIS — I10 Essential (primary) hypertension: Secondary | ICD-10-CM

## 2018-01-06 NOTE — Patient Instructions (Signed)
It was great to see you!  Please bring forms tomorrow and we can schedule a nurse visit for this.  Take care,  Jarold MottoSamantha Aaren Krog PA-C

## 2018-01-06 NOTE — Progress Notes (Signed)
Destiny Alvarez is a 52 y.o. female here to Establish Care.  I acted as a Neurosurgeonscribe for Energy East CorporationSamantha Robbi Spells, PA-C Corky Mullonna Orphanos, LPN  History of Present Illness:   Chief Complaint  Patient presents with  . 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 Patient denies chest pain, SOB, blurred vision, dizziness, unusual headaches, lower leg swelling. Patient is compliant with medication. Denies excessive caffeine intake, stimulant usage, excessive alcohol intake, or increase in salt consumption.  Per renal note, she is on eplrenone because of intolerance to spironolactone (HA). 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 Managed by [facility].   Weight -- Weight: 241 lb 4 oz (109.4 kg)   [facility] 01/06/2018  Decreased Interest 0  Down, Depressed, Hopeless 0  PHQ - 2 Score 0    No flowsheet data found.   Other providers/specialists: Patient Care Team: Jarold MottoWorley, Destiny Alvarez, [place] as PCP - General (Physician Assistant)   Past Medical History:  Diagnosis Date  . Hx of migraines   . Hyperaldosteronism (HCC)   . Hypertension   . Sickle cell trait (HCC)   . Vaginal delivery 06/24/1993     Social History   Socioeconomic History  . Marital status: Married    Spouse name: Not on file  . Number of children: Not on file  . Years of education: Not on file  . Highest education level: Not on file  Occupational History  . Not on file  Social Needs  . Financial resource strain: Not on file  . Food insecurity:    Worry: Not on file    Inability: Not on file  . Transportation needs:    Medical: Not on file    Non-medical: Not on file  Tobacco Use  . Smoking status: Never Smoker  . Smokeless tobacco: Never Used  Substance and Sexual Activity  . Alcohol use: Never    Frequency: Never  . Drug use: Never  . Sexual activity: Not Currently  Lifestyle  .  Physical activity:    Days per week: Not on file    Minutes per session: Not on file  . Stress: Not on file  Relationships  . Social connections:    Talks on phone: Not on file    Gets together: Not on file    Attends religious service: Not on file    Active member of club or organization: Not on file    Attends meetings of clubs or organizations: Not on file    Relationship status: Not on file  . Intimate partner violence:    Fear of current or ex partner: Not on file    Emotionally abused: Not on file    Physically abused: Not on file    Forced sexual activity: Not on file  Other Topics Concern  . Not on file  Social History Narrative   Public affairs consultantndustrial Engineer   One daughter, who lives in [place]    Past Surgical History:  Procedure Laterality Date  . WISDOM TOOTH EXTRACTION Left     Family History  Problem Relation Age of Onset  . Hypertension Mother   . Hypertension Father   . Heart attack Father   . Cancer Maternal Grandmother   . Hypertension Paternal Grandmother   . CVA Paternal Grandmother     Allergies  Allergen Reactions  . Losartan Cough     Current Medications:   Current Outpatient  Medications:  .  amLODipine (NORVASC) 10 MG tablet, Take 1 tablet (10 mg total) by mouth once daily, Disp: , Rfl:  .  labetalol (NORMODYNE) 100 MG tablet, TAKE 1 AND 1/2 TABLETS BY MOUTH TWICE A DAY, Disp: , Rfl:  .  Multiple Vitamin (MULTIVITAMIN) capsule, 1 daily, Disp: , Rfl:  .  eplerenone (INSPRA) 50 MG tablet, Take 50 mg by mouth 2 (two) times daily., Disp: , Rfl:  .  ferrous sulfate 325 (65 FE) MG tablet, Take 325 mg by mouth daily., Disp: , Rfl:    Review of Systems:   ROS Negative unless otherwise specified per HPI.  Vitals:   Vitals:   01/06/18 1324  BP: 140/88  Pulse: 63  Temp: 98.5 F (36.9 C)  TempSrc: Oral  SpO2: 96%  Weight: 241 lb 4 oz (109.4 kg)  Height: 6\' 3"  (1.905 m)      Body mass index is 30.15 kg/m.  Physical Exam:   Physical  Exam Vitals signs and nursing note reviewed.  Constitutional:      General: She is not in acute distress.    Appearance: She is well-developed. She is not ill-appearing or toxic-appearing.  HENT:     Head: Normocephalic and atraumatic.  Cardiovascular:     Rate and Rhythm: Normal rate and regular rhythm.     Heart sounds: Normal heart sounds.  Pulmonary:     Effort: Pulmonary effort is normal. No accessory muscle usage or respiratory distress.     Breath sounds: Normal breath sounds.  Skin:    General: Skin is warm and dry.  Neurological:     Mental Status: She is alert.  Psychiatric:        Speech: Speech normal.        Behavior: Behavior is cooperative.     Assessment and Plan:   Shirlie was seen today for establish care.  Diagnoses and all orders for this visit:  Hyperaldosteronism [facility]) and Essential hypertension Management per [facility].   Need for prophylactic vaccination with combined diphtheria-tetanus-pertussis (DTP) vaccine -     Tdap vaccine greater than or equal to 7yo IM  . Reviewed expectations re: course of current medical issues. . Discussed self-management of symptoms. . Outlined signs and symptoms indicating need for more acute intervention. . Patient verbalized understanding and all questions were answered. . See orders for this visit as documented in the electronic medical record. . Patient received an After-Visit Summary.  CMA or LPN served as scribe during this visit. History, Physical, and Plan performed by medical provider. The above documentation has been reviewed and is accurate and complete.  Jarold Motto, PA-C

## 2018-01-07 ENCOUNTER — Encounter: Payer: BLUE CROSS/BLUE SHIELD | Admitting: Physician Assistant

## 2018-01-07 ENCOUNTER — Ambulatory Visit (INDEPENDENT_AMBULATORY_CARE_PROVIDER_SITE_OTHER): Payer: BLUE CROSS/BLUE SHIELD | Admitting: *Deleted

## 2018-01-07 DIAGNOSIS — Z111 Encounter for screening for respiratory tuberculosis: Secondary | ICD-10-CM | POA: Diagnosis not present

## 2018-01-07 NOTE — Progress Notes (Signed)
Per orders of Jarold MottoSamantha Worley, PA-C, injection of Tubersol 0.1 cc given in  Left arm intradermal by Corky Mullonna Orphanos, LPN Patient tolerated injection well. Pt told to return on Friday for reading of PPD. Pt verbalized understanding.

## 2018-01-09 ENCOUNTER — Ambulatory Visit (INDEPENDENT_AMBULATORY_CARE_PROVIDER_SITE_OTHER): Payer: BLUE CROSS/BLUE SHIELD

## 2018-01-09 DIAGNOSIS — Z111 Encounter for screening for respiratory tuberculosis: Secondary | ICD-10-CM | POA: Diagnosis not present

## 2018-01-09 LAB — TB SKIN TEST
Induration: 0 mm
TB Skin Test: NEGATIVE

## 2018-01-09 NOTE — Addendum Note (Signed)
Addended by: Jimmye NormanPHANOS, Kaylum Shrum J on: 01/09/2018 04:21 PM   Modules accepted: Orders

## 2018-01-09 NOTE — Progress Notes (Signed)
Per orders of Lelon MastSamantha, PPD reading  by Donnamarie PoagJoellen Y Adham Schiano. PPD was negative form given to patient. She will call if any problems.

## 2018-01-19 ENCOUNTER — Encounter: Payer: Self-pay | Admitting: Physician Assistant

## 2018-01-19 DIAGNOSIS — R319 Hematuria, unspecified: Secondary | ICD-10-CM | POA: Insufficient documentation

## 2019-03-26 ENCOUNTER — Encounter: Payer: BC Managed Care – PPO | Admitting: Physician Assistant

## 2019-03-26 ENCOUNTER — Encounter: Payer: Self-pay | Admitting: Physician Assistant

## 2019-03-26 NOTE — Progress Notes (Deleted)
I acted as a Education administrator for [employer], PA-C Anselmo Pickler, LPN   Subjective:    Destiny Alvarez is a 54 y.o. female and is here for a comprehensive physical exam.   HPI  Health Maintenance Due  Topic Date Due  . HIV Screening  10/04/1979  . PAP SMEAR-Modifier  10/03/1985  . COLONOSCOPY  10/04/2014  . MAMMOGRAM  01/23/2018    Acute Concerns:   Chronic Issues:   Health Maintenance: Immunizations -- UTD Colonoscopy --  Mammogram -- overdue PAP -- overdue Bone Density -- N/A Diet -- *** Caffeine intake -- *** Sleep habits -- *** Exercise -- *** Weight --    Mood -- *** Weight history: Wt Readings from Last 10 Encounters:  01/06/18 241 lb 4 oz (109.4 kg)  03/07/11 244 lb (110.7 kg)   No LMP recorded. Period characteristics: Alcohol use: Tobacco use:  Depression screen PHQ 2/9 01/06/2018  Decreased Interest 0  Down, Depressed, Hopeless 0  PHQ - 2 Score 0     Other providers/specialists: Patient Care Team: Inda Coke, [place] as PCP - General (Physician Assistant) Puschinsky, Fransico Him., MD (General Surgery)    PMHx, SurgHx, SocialHx, Medications, and Allergies were reviewed in the Visit Navigator and updated as appropriate.   Past Medical History:  Diagnosis Date  . Hx of migraines   . Hyperaldosteronism ([place])   . Hypertension   . Sickle cell trait ([place])   . Vaginal delivery 06/24/1993    Past Surgical History:  Procedure Laterality Date  . WISDOM TOOTH EXTRACTION Left     Family History  Problem Relation Age of Onset  . Hypertension Mother   . Hypertension Father   . Heart attack Father   . Cancer Maternal Grandmother   . Hypertension Paternal Grandmother   . CVA Paternal Grandmother     Social History   Tobacco Use  . Smoking status: Never Smoker  . Smokeless tobacco: Never Used  Substance Use Topics  . Alcohol use: Never  . Drug use: Never    Review of Systems:   ROS  Objective:   There were no vitals taken for  this visit. There is no height or weight on file to calculate BMI.   General Appearance:    Alert, cooperative, no distress, appears stated age  Head:    Normocephalic, without obvious abnormality, atraumatic  Eyes:    PERRL, conjunctiva/corneas clear, EOM's intact, fundi    benign, both eyes  Ears:    Normal TM's and external ear canals, both ears  Nose:   Nares normal, septum midline, mucosa normal, no drainage    or sinus tenderness  Throat:   Lips, mucosa, and tongue normal; teeth and gums normal  Neck:   Supple, symmetrical, trachea midline, no adenopathy;    thyroid:  no enlargement/tenderness/nodules; no carotid   bruit or JVD  Back:     Symmetric, no curvature, ROM normal, no CVA tenderness  Lungs:     Clear to auscultation bilaterally, respirations unlabored  Chest Wall:    No tenderness or deformity   Heart:    Regular rate and rhythm, S1 and S2 normal, no murmur, rub or gallop  Breast Exam:    No tenderness, masses, or nipple abnormality  Abdomen:     Soft, non-tender, bowel sounds active all four quadrants,    no masses, no organomegaly  Genitalia:    Normal female without lesion, discharge or tenderness  Extremities:   Extremities normal, atraumatic, no cyanosis or edema  Pulses:   2+ and symmetric all extremities  Skin:   Skin color, texture, turgor normal, no rashes or lesions  Lymph nodes:   Cervical, supraclavicular, and axillary nodes normal  Neurologic:   CNII-XII intact, normal strength, sensation and reflexes    throughout   Results for orders placed or performed in visit on 01/07/18  TB Skin Test  Result Value Ref Range   TB Skin Test Negative    Induration 0 mm    Assessment/Plan:   There are no diagnoses linked to this encounter.   Well Adult Exam: Labs ordered: {Yes/No:18319::"Yes"}. Patient counseling was done. See below for items discussed. Discussed the patient's BMI.  The {BMI plan (MU NQF measure 421):19504} Follow up {follow-up interval:13814}.  Breast cancer screening: ***. Cervical cancer screening: ***   Patient Counseling: [x]    Nutrition: Stressed importance of moderation in sodium/caffeine intake, saturated fat and cholesterol, caloric balance, sufficient intake of fresh fruits, vegetables, fiber, calcium, iron, and 1 mg of folate supplement per day (for females capable of pregnancy).  [x]    Stressed the importance of regular exercise.   [x]    Substance Abuse: Discussed cessation/primary prevention of tobacco, alcohol, or other drug use; driving or other dangerous activities under the influence; availability of treatment for abuse.   [x]    Injury prevention: Discussed safety belts, safety helmets, smoke detector, smoking near bedding or upholstery.   [x]    Sexuality: Discussed sexually transmitted diseases, partner selection, use of condoms, avoidance of unintended pregnancy  and contraceptive alternatives.  [x]    Dental health: Discussed importance of regular tooth brushing, flossing, and dental visits.  [x]    Health maintenance and immunizations reviewed. Please refer to Health maintenance section.   ***  , PA-C [facility]

## 2019-03-31 ENCOUNTER — Ambulatory Visit (INDEPENDENT_AMBULATORY_CARE_PROVIDER_SITE_OTHER): Payer: BC Managed Care – PPO

## 2019-03-31 ENCOUNTER — Other Ambulatory Visit: Payer: Self-pay

## 2019-03-31 ENCOUNTER — Encounter: Payer: Self-pay | Admitting: Physician Assistant

## 2019-03-31 ENCOUNTER — Ambulatory Visit (INDEPENDENT_AMBULATORY_CARE_PROVIDER_SITE_OTHER): Payer: BC Managed Care – PPO | Admitting: Physician Assistant

## 2019-03-31 VITALS — BP 128/80 | HR 66 | Temp 97.5°F | Ht 75.0 in | Wt 252.2 lb

## 2019-03-31 DIAGNOSIS — M25551 Pain in right hip: Secondary | ICD-10-CM | POA: Diagnosis not present

## 2019-03-31 NOTE — Progress Notes (Signed)
Destiny Alvarez is a 54 y.o. female here for a new problem.  I acted as a Neurosurgeon for [employer], PA-C Corky Mull, LPN  History of Present Illness:   Chief Complaint  Patient presents with  . Hip Pain    HPI   Hip pain Does remember tripping/kicking over a curb prior to onset of symptoms. Pt c/o right hip pain, x 2 months. Hip is painful when she tries to use it after significant activity and stands for awhile. The pain is a sharp shooting pain that is relieved once she stops the activity. The severity of the sharp shooting pain is getting worse with time. She has not tried anything. She is having difficulty putting on clothes and shoes.  Denies personal history of osteoporosis, numbness, tingling, weakness.   Past Medical History:  Diagnosis Date  . Hx of migraines   . Hyperaldosteronism (HCC)   . Sickle cell trait (HCC)   . Vaginal delivery 06/24/1993     Social History   Socioeconomic History  . Marital status: Married    Spouse name: Not on file  . Number of children: Not on file  . Years of education: Not on file  . Highest education level: Not on file  Occupational History  . Not on file  Tobacco Use  . Smoking status: Never Smoker  . Smokeless tobacco: Never Used  Substance and Sexual Activity  . Alcohol use: Never  . Drug use: Never  . Sexual activity: Not Currently  Other Topics Concern  . 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 Difficulty of Paying Living Expenses: Not on file  Food Insecurity:   . Worried About Programme researcher, broadcasting/film/video in the Last Year: Not on file  . Ran Out of Food in the Last Year: Not on file  Transportation Needs:   . Lack of Transportation (Medical): Not on file  . Lack of Transportation (Non-Medical): Not on file  Physical Activity:   . Days of Exercise per Week: Not on file  . Minutes of Exercise  per Session: Not on file  Stress:   . Feeling of Stress : Not on file  Social Connections:   . Frequency of Communication with Friends and Family: Not on file  . Frequency of Social Gatherings with Friends and Family: Not on file  . Attends Religious Services: Not on file  . Active Member of Clubs or Organizations: Not on file  . Attends Banker Meetings: Not on file  . Marital Status: Not on file  Intimate Partner Violence:   . Fear of Current or Ex-Partner: Not on file  . Emotionally Abused: Not on file  . Physically Abused: Not on file  . Sexually Abused: Not on file    Past Surgical History:  Procedure Laterality Date  . WISDOM TOOTH EXTRACTION Left     Family History  Problem Relation Age of Onset  . Hypertension Mother   . Hypertension Father   . Heart attack Father   . Cancer Maternal Grandmother        breast and lung  . Hypertension Paternal Grandmother   . CVA Paternal Grandmother     Allergies  Allergen Reactions  . Losartan Cough    Current Medications:   Current Outpatient Medications:  .  amLODipine (NORVASC) 10 MG tablet, Take 1 tablet (10  mg total) by mouth once daily, Disp: , Rfl:  .  eplerenone (INSPRA) 50 MG tablet, Take 50 mg by mouth 2 (two) times daily., Disp: , Rfl:  .  labetalol (NORMODYNE) 100 MG tablet, TAKE 1 AND 1/2 TABLETS BY MOUTH TWICE A DAY, Disp: , Rfl:  .  Multiple Vitamin (MULTIVITAMIN) capsule, 1 daily, Disp: , Rfl:  .  ferrous sulfate 325 (65 FE) MG tablet, Take 325 mg by mouth daily., Disp: , Rfl:    Review of Systems:   ROS  Negative unless otherwise specified per HPI.  Vitals:   Vitals:   03/31/19 0810  BP: 128/80  Pulse: 66  Temp: (!) 97.5 F (36.4 C)  TempSrc: Temporal  SpO2: 97%  Weight: 252 lb 4 oz (114.4 kg)  Height: 6\' 3"  (1.905 m)     Body mass index is 31.53 kg/m.  Physical Exam:   Physical Exam Constitutional:      Appearance: She is well-developed.  HENT:     Head: Normocephalic  and atraumatic.  Eyes:     Conjunctiva/sclera: Conjunctivae normal.  Pulmonary:     Effort: Pulmonary effort is normal.  Musculoskeletal:        General: Normal range of motion.     Cervical back: Normal range of motion and neck supple.     Comments: R hip: Normal passive and resisted ROM No reproduction of pain with movement No point tenderness or gait abnormality  Skin:    General: Skin is warm and dry.  Neurological:     Mental Status: She is alert and oriented to person, place, and time.  Psychiatric:        Behavior: Behavior normal.        Thought Content: Thought content normal.        Judgment: Judgment normal.      Assessment and Plan:   Jeremy was seen today for hip pain.  Diagnoses and all orders for this visit:  Right hip pain -     DG HIP UNILAT W OR W/O PELVIS 2-3 VIEWS RIGHT; Future   Unclear etiology of symptoms. She states that she will track her symptoms and log them. Will obtain xray for further evaluation. She declines medications. Recommend that if symptoms worsen or persist despite treatment, to let us know and we will get her to sports medicine. Patient agreeable to plan.  . Reviewed expectations re: course of current medical issues. . Discussed self-management of symptoms. . Outlined signs and symptoms indicating need for more acute intervention. . Patient verbalized understanding and all questions were answered. . See orders for this visit as documented in the electronic medical record. . Patient received an After-Visit Summary.  CMA or LPN served as scribe during this visit. History, Physical, and Plan performed by medical provider. The above documentation has been reviewed and is accurate and complete.  Inda Coke, PA-C

## 2019-03-31 NOTE — Patient Instructions (Signed)
It was great to see you!  I will be in touch with your xray results.  Please keep track of your symptoms.  Consider anti-inflammatories (such as advil) if bothersome.  If no improvement or continues to get worse, we will get you to sports medicine.  Take care,  Jarold Motto PA-C

## 2019-04-01 ENCOUNTER — Telehealth: Payer: Self-pay | Admitting: Physician Assistant

## 2019-04-01 NOTE — Telephone Encounter (Signed)
Patient is calling in for the results of her x-ray, asked for someone to give her a call.

## 2019-04-02 ENCOUNTER — Other Ambulatory Visit: Payer: Self-pay

## 2019-04-02 ENCOUNTER — Telehealth: Payer: Self-pay

## 2019-04-02 DIAGNOSIS — M25551 Pain in right hip: Secondary | ICD-10-CM

## 2019-04-02 NOTE — Telephone Encounter (Signed)
Spoke to patient see results note

## 2019-04-02 NOTE — Telephone Encounter (Signed)
See results notes. 

## 2019-04-02 NOTE — Telephone Encounter (Signed)
Patient following up labs results and x-rays.

## 2019-04-09 ENCOUNTER — Ambulatory Visit: Payer: BC Managed Care – PPO | Admitting: Family Medicine

## 2019-04-09 NOTE — Progress Notes (Deleted)
   Subjective:    I'm seeing this patient as a consultation for:  Destiny Alvarez, [name]. Note will be routed back to referring provider/PCP.  CC: R hip pain  I, Luana Tatro, LAT, ATC, am serving as scribe for Dr. Clementeen Graham.  HPI: Pt is a 54 y/o female presenting w/ c/o R hip pain x 2 months w/ no known MOI.  She describes her pain as intermittent, sharp, shooting pain.  Radiating pain: R knee swelling: R knee mechanical symptoms: Aggravating factors: Prolonged standing and strenuous activity Treatments tried:  Diagnostic imaging: R hip XR- 03/31/19  Past medical history, Surgical history, Family history, Social history, Allergies, and medications have been entered into the medical record, reviewed. ***  Review of Systems: No new headache, visual changes, nausea, vomiting, diarrhea, constipation, dizziness, abdominal pain, skin rash, fevers, chills, night sweats, weight loss, swollen lymph nodes, body aches, joint swelling, muscle aches, chest pain, shortness of breath, mood changes, visual or auditory hallucinations.   Objective:   There were no vitals filed for this visit. General: Well Developed, well nourished, and in no acute distress.  Neuro/Psych: Alert and oriented x3, extra-ocular muscles intact, able to move all 4 extremities, sensation grossly intact. Skin: Warm and dry, no rashes noted.  Respiratory: Not using accessory muscles, speaking in full sentences, trachea midline.  Cardiovascular: Pulses palpable, no extremity edema. Abdomen: Does not appear distended. MSK: ***  Lab and Radiology Results No results found for this or any previous visit (from the past 72 hour(s)). No results found.  Impression and Recommendations:    Assessment and Plan: 54 y.o. female with ***.  PDMP not reviewed this encounter. No orders of the defined types were placed in this encounter.  No orders of the defined types were placed in this encounter.   Discussed warning signs or symptoms.  Please see discharge instructions. Patient expresses understanding.   ***

## 2019-04-12 ENCOUNTER — Other Ambulatory Visit: Payer: Self-pay

## 2019-04-12 ENCOUNTER — Ambulatory Visit: Payer: BC Managed Care – PPO | Admitting: Family Medicine

## 2019-04-12 ENCOUNTER — Encounter: Payer: Self-pay | Admitting: Family Medicine

## 2019-04-12 VITALS — BP 160/96 | HR 80 | Ht 75.0 in | Wt 258.0 lb

## 2019-04-12 DIAGNOSIS — M25551 Pain in right hip: Secondary | ICD-10-CM | POA: Diagnosis not present

## 2019-04-12 NOTE — Progress Notes (Signed)
Subjective:    I'm seeing this patient as a consultation for:  Len Blalock, [place]. Note will be routed back to referring provider/PCP.  CC: R hip pain  I, Molly Weber, LAT, ATC, am serving as scribe for Dr. Lynne Leader.  HPI: Pt is a 54 y/o female presenting w/ c/o R hip pain x approximately 2 months after tripping over a curb.  She reports sharp, shooting pain that has been worsening.  She notes pain occurs at the anterior and lateral hip and can be stabbing to aching.  Pain is typically worse with prolonged standing.  Sometimes she has pain with hip flexion and has some difficulty putting her socks on.  She notes that sitting in a car often helps her pain feel better.  She denies pain radiating below the level of the thigh.  No weakness or numbness distally bowel or bladder dysfunction.  She denies significant locking or catching.  She is tried some over-the-counter medications for pain which have helped only a little.   Diagnostic imaging: R hip XR- 03/31/19  Past medical history, Surgical history, Family history, Social history, Allergies, and medications have been entered into the medical record, reviewed.   Review of Systems: No new headache, visual changes, nausea, vomiting, diarrhea, constipation, dizziness, abdominal pain, skin rash, fevers, chills, night sweats, weight loss, swollen lymph nodes, body aches, joint swelling, muscle aches, chest pain, shortness of breath, mood changes, visual or auditory hallucinations.   Objective:    Vitals:   04/12/19 0817  BP: (!) 160/96  Pulse: 80  SpO2: 98%   General: Well Developed, well nourished, and in no acute distress.  Neuro/Psych: Alert and oriented x3, extra-ocular muscles intact, able to move all 4 extremities, sensation grossly intact. Skin: Warm and dry, no rashes noted.  Respiratory: Not using accessory muscles, speaking in full sentences, trachea midline.  Cardiovascular: Pulses palpable, no extremity edema. Abdomen: Does not  appear distended. MSK: Right hip: Normal-appearing Normal motion no significant pain with internal rotation or flexion. Tender palpation greater trochanter. Hip abduction strength diminished 4/5.  External rotation strength diminished 4/5.  Adduction strength 5/5.   Lab and Radiology Results DG HIP UNILAT W OR W/O PELVIS 2-3 VIEWS RIGHT  Result Date: 03/31/2019 CLINICAL DATA:  Pain EXAM: DG HIP (WITH OR WITHOUT PELVIS) 2-3V RIGHT COMPARISON:  None. FINDINGS: Frontal pelvis as well as frontal and lateral right hip images were obtained. No fracture or dislocation. There is minimal symmetric narrowing of each hip joint. Sacroiliac joints appear normal. No erosive change. There is bony overgrowth along each superolateral acetabulum, more severe on the right than on the left. IMPRESSION: Bony overgrowth along each superolateral acetabulum, more severe on the right than on the left. This finding places patient at increased risk for potential femoroacetabular impingement. Minimal symmetric narrowing of each hip joint. No fracture or dislocation. Electronically Signed   By: Lowella Grip III M.D.   On: 03/31/2019 12:10   I, Lynne Leader, personally (independently) visualized and performed the interpretation of the images attached in this note.   Impression and Recommendations:    Assessment and Plan: 54 y.o. female with right hip pain ongoing for 2 months.  Pain located anterior and lateral.  Patient does have bony overgrowth on x-ray increasing risk for femoral acetabular impingement.  Pain is multifactorial.  Suspect femoral acetabular impingement versus hip flexor strain.  Additionally patient has hip abductor tendinopathy/trochanteric bursitis.  Discussed options.  Plan for trial of physical therapy and home exercise  program.  Check back in 4 to 6 weeks if not better likely would proceed with MRI arthrogram.  PDMP not reviewed this encounter. Orders Placed This Encounter  Procedures  .  Ambulatory referral to Physical Therapy    Referral Priority:   Routine    Referral Type:   Physical Medicine    Referral Reason:   Specialty Services Required    Requested Specialty:   Physical Therapy   No orders of the defined types were placed in this encounter.   Discussed warning signs or symptoms. Please see discharge instructions. Patient expresses understanding.   The above documentation has been reviewed and is accurate and complete Clementeen Graham

## 2019-04-12 NOTE — Patient Instructions (Addendum)
Thank you for coming in today. Attend PT.  Do some of the home exercises we teach you today.  Recheck with me in 4-6 weeks.  Return sooner if needed.   I think you may have femoral acetabular impingement.  You also have hip abductor tendonitis.

## 2019-04-26 ENCOUNTER — Ambulatory Visit: Payer: BC Managed Care – PPO

## 2019-05-03 ENCOUNTER — Other Ambulatory Visit: Payer: Self-pay

## 2019-05-03 ENCOUNTER — Ambulatory Visit: Payer: BC Managed Care – PPO | Attending: Family Medicine

## 2019-05-03 DIAGNOSIS — M25651 Stiffness of right hip, not elsewhere classified: Secondary | ICD-10-CM | POA: Insufficient documentation

## 2019-05-03 DIAGNOSIS — M25551 Pain in right hip: Secondary | ICD-10-CM | POA: Insufficient documentation

## 2019-05-03 NOTE — Therapy (Signed)
[facility]patient Rehabilitation [facility] [address] [place], [place], [address] Phone: [phone]   Fax:  [phone]  Physical Therapy Evaluation  Patient Details  Name: Destiny Alvarez MRN: [id number] Date of Birth: Aug 24, 1965 No data recorded  Encounter Date: 05/03/2019  PT End of Session - 05/03/19 0845    Visit Number  1    Number of Visits  12    Date for PT Re-Evaluation  06/11/19    Authorization Type  BCBS    PT Start Time  0800    PT Stop Time  0845    PT Time Calculation (min)  45 min    Activity Tolerance  Patient tolerated treatment well    Behavior During Therapy  [facility] for tasks assessed/performed       Past Medical History:  Diagnosis Date  . Hx of migraines   . Hyperaldosteronism (HCC)   . Sickle cell trait (HCC)   . Vaginal delivery 06/24/1993    Past Surgical History:  Procedure Laterality Date  . WISDOM TOOTH EXTRACTION Left     There were no vitals filed for this visit.   Subjective Assessment - 05/03/19 0801    Subjective  She reports RT hip pain. May be easing but after standing for hours pain makes difficult to lift RT leg.     Started 12/20.   Days    Limitations  Standing   ROM on RT hip   How long can you sit comfortably?  helps ease pain most times    How long can you stand comfortably?  2-3 hours    How long can you walk comfortably?  no issues    Diagnostic tests  xray    Patient Stated Goals  Decrease pain and improved function    Currently in Pain?  Yes    Pain Location  Hip    Pain Orientation  Right;Posterior;Lateral    Pain Descriptors / Indicators  Aching    Pain Type  Chronic pain    Pain Onset  More than a month ago    Pain Frequency  Intermittent    Aggravating Factors   moving in specific directions , standing    Pain Relieving Factors  change position         [facility] PT Assessment - 05/03/19 0001      Posture/Postural Control   Posture Comments  RT shoulder high , mild incr thoracic rounded   decreased weight to  RT       ROM / Strength   AROM / PROM / Strength  AROM;Strength      AROM   AROM Assessment Site  Lumbar;Hip    Right/Left Hip  Right    Right Hip Extension  -10    Right Hip Flexion  120    Right Hip External Rotation   35    Right Hip Internal Rotation   12    Right Hip ABduction  32    Right Hip ADduction  25    Lumbar Flexion  75%    Lumbar Extension  WNL      Strength   Overall Strength Comments  4+/5 to 5/5 RT hip      Flexibility   Soft Tissue Assessment /Muscle Length  yes    Hamstrings  60-65 degrees    Quadriceps  100 degrees in prone      Ambulation/Gait   Gait Comments  lateral LT trunk.  Objective measurements completed on examination: See above findings.              PT Education - 05/03/19 0844    Education Details  POC , HEP    Person(s) Educated  Patient    Methods  Explanation;Demonstration;Tactile cues;Verbal cues;Handout    Comprehension  Verbalized understanding;Returned demonstration       PT Short Term Goals - 05/03/19 0851      PT SHORT TERM GOAL #1   Title  Shre will be independent with initial HEP    Time  3    Period  Weeks    Status  New      PT SHORT TERM GOAL #2   Title  AROM RT hip extension to neutral    Time  3    Period  Weeks    Status  New      PT SHORT TERM GOAL #3   Title  She will rewport 30% decr Pain    Time  3    Period  Weeks    Status  New        PT Long Term Goals - 05/03/19 2956      PT LONG TERM GOAL #1   Title  She will be independent witrh all HEP    Time  6    Period  Weeks    Status  New      PT LONG TERM GOAL #2   Title  She will report 50% imperovement with decr pain    Time  6    Period  Weeks    Status  New      PT LONG TERM GOAL #3   Title  She will report abl to stand 2-3 hours without pain    Time  6    Period  Weeks    Status  New             Plan - 05/03/19 0845    Clinical Impression Statement  MS Johson  presents witth chronic intermittant RT hip pain without injury.   She should do well increasing ROM and strength  with decreased pain with skilled PT and consistent HEP    Examination-Activity Limitations  Bend;Stand    Examination-Participation Restrictions  Community Activity    Stability/Clinical Decision Making  Stable/Uncomplicated    Clinical Decision Making  Low    Rehab Potential  Good    PT Frequency  2x / week    PT Duration  6 weeks    PT Treatment/Interventions  Cryotherapy;Moist Heat;Ultrasound;Therapeutic activities;Therapeutic exercise;Manual techniques;Patient/family education;Passive range of motion    PT Next Visit Plan  Review HEP, Add  for ROM, Manual for ROM    PT Home Exercise Plan  bridge, SLR , Stretch ER , flexion , extension    Consulted and Agree with Plan of Care  Patient       Patient will benefit from skilled therapeutic intervention in order to improve the following deficits and impairments:  Decreased range of motion, Pain, Postural dysfunction, Decreased strength, Decreased activity tolerance  Visit Diagnosis: Pain in right hip  Stiffness of right hip, not elsewhere classified     Problem List Patient Active Problem List   Diagnosis Date Noted  . Hematuria 01/19/2018  . Hyperaldosteronism ([place]) 09/05/2011  . Essential hypertension 09/05/2011  . Sickle cell trait ([place]) 09/05/2011  . Migraine without aura 07/24/2011    Darrel Hoover PT 05/03/2019, 8:56 AM  [facility] [address] [place], [place], [address] Phone: [phone]   Fax:  [phone]  Name: Destiny Alvarez MRN: [id number] Date of Birth: 01-05-1966

## 2019-05-03 NOTE — Patient Instructions (Signed)
Stretching  Knee to chest, thomas , ER 2x/day 22-3 reps 30 sec RT leg,  Bridge and SLR x 10 2x/day

## 2019-05-10 ENCOUNTER — Ambulatory Visit: Payer: BC Managed Care – PPO

## 2019-05-10 ENCOUNTER — Telehealth: Payer: Self-pay | Admitting: Physical Therapy

## 2019-05-10 NOTE — Telephone Encounter (Signed)
Unable to leave a message due to her mailbox being full.

## 2019-05-13 ENCOUNTER — Ambulatory Visit: Payer: BC Managed Care – PPO

## 2019-05-17 ENCOUNTER — Ambulatory Visit: Payer: BC Managed Care – PPO

## 2019-05-17 ENCOUNTER — Other Ambulatory Visit: Payer: Self-pay

## 2019-05-17 DIAGNOSIS — M25551 Pain in right hip: Secondary | ICD-10-CM

## 2019-05-17 DIAGNOSIS — M25651 Stiffness of right hip, not elsewhere classified: Secondary | ICD-10-CM

## 2019-05-17 NOTE — Therapy (Signed)
[facility] [address] [place], [place], [address] Phone: [phone]   Fax:  [phone]  Physical Therapy Treatment  Patient Details  Name: Destiny Alvarez MRN: [id number] Date of Birth: Feb 15, 1965 No data recorded  Encounter Date: 05/17/2019  PT End of Session - 05/17/19 0712    Visit Number  2    Number of Visits  12    Date for PT Re-Evaluation  06/11/19    Authorization Type  BCBS    PT Start Time  0715   pt late   PT Stop Time  0744    PT Time Calculation (min)  29 min    Activity Tolerance  Patient tolerated treatment well    Behavior During Therapy  [facility] for tasks assessed/performed       Past Medical History:  Diagnosis Date  . Hx of migraines   . Hyperaldosteronism (HCC)   . Sickle cell trait (HCC)   . Vaginal delivery 06/24/1993    Past Surgical History:  Procedure Laterality Date  . WISDOM TOOTH EXTRACTION Left     There were no vitals filed for this visit.  Subjective Assessment - 05/17/19 0717    Subjective  She reports no pain today.  No problems with hEP. Doing them daily    Currently in Pain?  No/denies                       [facility] Adult PT Treatment/Exercise - 05/17/19 0001      Exercises   Exercises  Knee/Hip      Knee/Hip Exercises: Stretches   Hip Flexor Stretch  Right;Left;2 reps;30 seconds    Piriformis Stretch  Right;Left;2 reps;30 seconds    Piriformis Stretch Limitations  and figure 4 strech      Knee/Hip Exercises: Aerobic   Nustep  L4 LE only  5 min      Knee/Hip Exercises: Supine   Bridges  Both;20 reps    Straight Leg Raises  Right;Left;15 reps      Knee/Hip Exercises: Sidelying   Hip ABduction  Right;Left;15 reps    Clams  RT?LT x 15             PT Education - 05/17/19 0831    Education Details  HEP    Person(s) Educated  Patient    Methods  Demonstration;Explanation;Tactile cues;Verbal cues;Handout    Comprehension  Verbalized  understanding;Returned demonstration       PT Short Term Goals - 05/03/19 0851      PT SHORT TERM GOAL #1   Title  Shre will be independent with initial HEP    Time  3    Period  Weeks    Status  New      PT SHORT TERM GOAL #2   Title  AROM RT hip extension to neutral    Time  3    Period  Weeks    Status  New      PT SHORT TERM GOAL #3   Title  She will rewport 30% decr Pain    Time  3    Period  Weeks    Status  New        PT Long Term Goals - 05/03/19 6440      PT LONG TERM GOAL #1   Title  She will be independent witrh all HEP    Time  6    Period  Weeks    Status  New  PT LONG TERM GOAL #2   Title  She will report 50% imperovement with decr pain    Time  6    Period  Weeks    Status  New      PT LONG TERM GOAL #3   Title  She will report abl to stand 2-3 hours without pain    Time  6    Period  Weeks    Status  New            Plan - 05/17/19 7619    Clinical Impression Statement  She was able to demo HEP correctly and added to HEP for strength.    PT Treatment/Interventions  Cryotherapy;Moist Heat;Ultrasound;Therapeutic activities;Therapeutic exercise;Manual techniques;Patient/family education;Passive range of motion    PT Next Visit Plan  Review HEP, Add  for ROM, Manual for ROM    PT Home Exercise Plan  bridge, SLR , Stretch ER , flexion , extension    Consulted and Agree with Plan of Care  Patient       Patient will benefit from skilled therapeutic intervention in order to improve the following deficits and impairments:  Decreased range of motion, Pain, Postural dysfunction, Decreased strength, Decreased activity tolerance  Visit Diagnosis: Stiffness of right hip, not elsewhere classified  Pain in right hip     Problem List Patient Active Problem List   Diagnosis Date Noted  . Hematuria 01/19/2018  . Hyperaldosteronism ([place]) 09/05/2011  . Essential hypertension 09/05/2011  . Sickle cell trait ([place]) 09/05/2011  . Migraine  without aura 07/24/2011    Darrel Hoover  PT 05/17/2019, 8:33 AM  [facility] [address] [place], [place], [address] Phone: [phone]   Fax:  [phone]  Name: Destiny Alvarez MRN: [id number] Date of Birth: Oct 23, 1965

## 2019-05-17 NOTE — Patient Instructions (Signed)
Green band clam and hip abduction in side lye   1-2x/day 10-20 reps  RT/LT

## 2019-05-20 ENCOUNTER — Ambulatory Visit: Payer: BC Managed Care – PPO

## 2019-05-20 ENCOUNTER — Other Ambulatory Visit: Payer: Self-pay

## 2019-05-20 DIAGNOSIS — M25551 Pain in right hip: Secondary | ICD-10-CM

## 2019-05-20 DIAGNOSIS — M25651 Stiffness of right hip, not elsewhere classified: Secondary | ICD-10-CM

## 2019-05-20 NOTE — Therapy (Signed)
[place], [place], [address] Phone: [phone]   Fax:  [phone]  Physical Therapy Treatment  Patient Details  Name: Destiny Alvarez MRN: [id number] Date of Birth: Jun 01, 1965 No data recorded  Encounter Date: 05/20/2019  PT End of Session - 05/20/19 0708    Visit Number  3    Number of Visits  12    Date for PT Re-Evaluation  06/11/19    Authorization Type  BCBS    PT Start Time  0705    PT Stop Time  0744    PT Time Calculation (min)  39 min    Activity Tolerance  Patient tolerated treatment well    Behavior During Therapy  [facility] for tasks assessed/performed       Past Medical History:  Diagnosis Date  . Hx of migraines   . Hyperaldosteronism ([place])   . Sickle cell trait ([place])   . Vaginal delivery 06/24/1993    Past Surgical History:  Procedure Laterality Date  . WISDOM TOOTH EXTRACTION Left     There were no vitals filed for this visit.  Subjective Assessment - 05/20/19 0710    Subjective  No pain . No issues with the HEP.    Currently in Pain?  No/denies                       [facility] Adult PT Treatment/Exercise - 05/20/19 0001      Knee/Hip Exercises: Aerobic   Nustep  L5  LE only  6 min      Knee/Hip Exercises: Standing   Hip Abduction  Right;Left;10 reps    Abduction Limitations  green band    Hip Extension  Right;Left;Knee straight;10 reps    Extension Limitations  green band    Functional Squat  15 reps      Knee/Hip Exercises: Supine   Bridges  Both    Bridges Limitations  x 15 cued to keep abdominals set and pelvic tilt    Straight Leg Raises  Right;Left;15 reps    Other Supine Knee/Hip Exercises  PPT x 10 5 sec hold      Knee/Hip Exercises: Sidelying   Hip ABduction  Right;Left;15 reps    Hip ABduction Limitations  green band    Clams  RT/LT x 15, green band               PT Short Term Goals - 05/03/19 0851      PT SHORT TERM GOAL #1   Title  Shre will be independent with initial HEP    Time  3    Period  Weeks    Status  New      PT SHORT TERM GOAL #2   Title  AROM RT hip extension to neutral    Time  3    Period  Weeks    Status  New      PT SHORT TERM GOAL #3   Title  She will rewport 30% decr Pain    Time  3    Period  Weeks    Status  New        PT Long Term Goals - 05/03/19 5631      PT LONG TERM GOAL #1   Title  She will be independent witrh all HEP    Time  6    Period  Weeks    Status  New      PT LONG TERM GOAL #  2   Title  She will report 50% imperovement with decr pain    Time  6    Period  Weeks    Status  New      PT LONG TERM GOAL #3   Title  She will report abl to stand 2-3 hours without pain    Time  6    Period  Weeks    Status  New            Plan - 05/20/19 0737    PT Treatment/Interventions  Cryotherapy;Moist Heat;Ultrasound;Therapeutic activities;Therapeutic exercise;Manual techniques;Patient/family education;Passive range of motion    PT Next Visit Plan  Review HEP, Add  for ROM, Manual for ROM    PT Home Exercise Plan  bridge, SLR , Stretch ER , flexion , extension    Consulted and Agree with Plan of Care  Patient       Patient will benefit from skilled therapeutic intervention in order to improve the following deficits and impairments:  Decreased range of motion, Pain, Postural dysfunction, Decreased strength, Decreased activity tolerance  Visit Diagnosis: Stiffness of right hip, not elsewhere classified  Pain in right hip     Problem List Patient Active Problem List   Diagnosis Date Noted  . Hematuria 01/19/2018  . Hyperaldosteronism (HCC) 09/05/2011  . Essential hypertension 09/05/2011  . Sickle cell trait (HCC) 09/05/2011  . Migraine without aura 07/24/2011    Caprice Red  PT 05/20/2019, 7:48 AM  [facility] [address] [place], [place], [address] Phone: [phone]   Fax:   [phone]  Name: Ysela Hettinger MRN: [id number] Date of Birth: 12-29-1965

## 2019-05-24 ENCOUNTER — Ambulatory Visit: Payer: BC Managed Care – PPO | Attending: Family Medicine

## 2019-05-24 ENCOUNTER — Ambulatory Visit: Payer: BC Managed Care – PPO | Admitting: Family Medicine

## 2019-05-24 ENCOUNTER — Other Ambulatory Visit: Payer: Self-pay

## 2019-05-24 DIAGNOSIS — M25651 Stiffness of right hip, not elsewhere classified: Secondary | ICD-10-CM | POA: Insufficient documentation

## 2019-05-24 DIAGNOSIS — Z0289 Encounter for other administrative examinations: Secondary | ICD-10-CM

## 2019-05-24 DIAGNOSIS — M25551 Pain in right hip: Secondary | ICD-10-CM | POA: Insufficient documentation

## 2019-05-24 NOTE — Progress Notes (Deleted)
   I, Christoper Fabian, LAT, ATC, am serving as scribe for Dr. Clementeen Graham.  Destiny Alvarez is a 54 y.o. female who presents to [facility] today for f/u of R hip pain after tripping over a curb.  She was last seen by Dr. Denyse Amass on 04/12/19 and was referred to outpatient PT of which she has completed 3 visits.  Since her last visit, pt reports   Diagnostic testing: R hip XR- 03/31/19   Pertinent review of systems: ***  Relevant historical information: ***   Exam:  There were no vitals taken for this visit. General: Well Developed, well nourished, and in no acute distress.   MSK: ***    Lab and Radiology Results No results found for this or any previous visit (from the past 72 hour(s)). No results found.     Assessment and Plan: 54 y.o. female with ***   PDMP not reviewed this encounter. No orders of the defined types were placed in this encounter.  No orders of the defined types were placed in this encounter.    Discussed warning signs or symptoms. Please see discharge instructions. Patient expresses understanding.   ***

## 2019-05-24 NOTE — Therapy (Signed)
[place], [place], [address] Phone: [phone]   Fax:  [phone]  Physical Therapy Treatment  Patient Details  Name: Destiny Alvarez MRN: [id number] Date of Birth: September 29, 1965 No data recorded  Encounter Date: 05/24/2019  PT End of Session - 05/24/19 0706    Visit Number  4    Number of Visits  12    Date for PT Re-Evaluation  06/11/19    Authorization Type  BCBS    PT Start Time  0707    PT Stop Time  0745    PT Time Calculation (min)  38 min    Activity Tolerance  Patient tolerated treatment well    Behavior During Therapy  [facility] for tasks assessed/performed       Past Medical History:  Diagnosis Date  . Hx of migraines   . Hyperaldosteronism ([place])   . Sickle cell trait ([place])   . Vaginal delivery 06/24/1993    Past Surgical History:  Procedure Laterality Date  . WISDOM TOOTH EXTRACTION Left     There were no vitals filed for this visit.  Subjective Assessment - 05/24/19 0707    Subjective  No pain ding pretty good. Last pain  with workout doing leg lifts  at end of reps . Less pain that 01/2019.    Now able to  continue activity without stopping   Amonth since last significant pain.  Wonders if it was an injury that is Administrator.    Currently in Pain?  No/denies                       [facility] Adult PT Treatment/Exercise - 05/24/19 0001      Knee/Hip Exercises: Stretches   Hip Flexor Stretch  Right;Left;2 reps;30 seconds    Piriformis Stretch  Right;Left;2 reps;30 seconds    Piriformis Stretch Limitations  and figure 4 strech      Knee/Hip Exercises: Aerobic   Nustep  L5  LE only  7 min      Knee/Hip Exercises: Standing   Hip Abduction  Right;Left;15 reps    Abduction Limitations  blue band    Hip Extension  Right;Left;15 reps    Extension Limitations  blue band    Functional Squat  20 reps      Knee/Hip Exercises: Supine   Bridges  Both    Bridges Limitations  x  15 cued to keep abdominals set and pelvic tilt    Single Leg Bridge  Right;Left;5 reps    Straight Leg Raises  Right;Left;15 reps      Knee/Hip Exercises: Sidelying   Hip ABduction  Right;Left;15 reps    Hip ABduction Limitations  blue band    Clams  RT/LT x 15, blue band             PT Education - 05/24/19 0829    Education Details  HEP    Person(s) Educated  Patient    Methods  Explanation;Demonstration;Tactile cues;Verbal cues    Comprehension  Verbalized understanding;Returned demonstration       PT Short Term Goals - 05/24/19 0829      PT SHORT TERM GOAL #1   Title  Shre will be independent with initial HEP    Status  Achieved      PT SHORT TERM GOAL #3   Title  She will rewport 30% decr Pain    Status  Achieved        PT Long  Term Goals - 05/03/19 4650      PT LONG TERM GOAL #1   Title  She will be independent witrh all HEP    Time  6    Period  Weeks    Status  New      PT LONG TERM GOAL #2   Title  She will report 50% imperovement with decr pain    Time  6    Period  Weeks    Status  New      PT LONG TERM GOAL #3   Title  She will report abl to stand 2-3 hours without pain    Time  6    Period  Weeks    Status  New            Plan - 05/24/19 3546    Clinical Impression Statement  She is doing better without any significnat pain for a month. She is able to do the progressively more difficulty exercise without pain    PT Treatment/Interventions  Cryotherapy;Moist Heat;Ultrasound;Therapeutic activities;Therapeutic exercise;Manual techniques;Patient/family education;Passive range of motion    PT Next Visit Plan  Review HEP, Add  for ROM, Manual for ROM    PT Home Exercise Plan  bridge, SLR , Stretch ER , flexion , extension, Shoulder bridge, PPT   side clam and hip abduction    Consulted and Agree with Plan of Care  Patient       Patient will benefit from skilled therapeutic intervention in order to improve the following deficits and  impairments:  Decreased range of motion, Pain, Postural dysfunction, Decreased strength, Decreased activity tolerance  Visit Diagnosis: Stiffness of right hip, not elsewhere classified  Pain in right hip     Problem List Patient Active Problem List   Diagnosis Date Noted  . Hematuria 01/19/2018  . Hyperaldosteronism (HCC) 09/05/2011  . Essential hypertension 09/05/2011  . Sickle cell trait (HCC) 09/05/2011  . Migraine without aura 07/24/2011    Caprice Red  PT 05/24/2019, 8:30 AM  [facility] [address] [place], [place], [address] Phone: [phone]   Fax:  [phone]  Name: Destiny Alvarez MRN: [id number] Date of Birth: 18-Aug-1965

## 2019-05-24 NOTE — Patient Instructions (Signed)
Prone SLR , single leg bridge  X 5-10 rpes daily RT and LT  2-3 sec hold

## 2019-05-27 ENCOUNTER — Ambulatory Visit: Payer: BC Managed Care – PPO

## 2019-05-27 ENCOUNTER — Other Ambulatory Visit: Payer: Self-pay

## 2019-05-27 DIAGNOSIS — M25551 Pain in right hip: Secondary | ICD-10-CM

## 2019-05-27 DIAGNOSIS — M25651 Stiffness of right hip, not elsewhere classified: Secondary | ICD-10-CM

## 2019-05-27 NOTE — Therapy (Signed)
[facility] Outpatient Rehabilitation [facility] [address] [place], [place], [address] Phone: [phone]   Fax:  [phone]  Physical Therapy Treatment  Patient Details  Name: Destiny Alvarez MRN: [id number] Date of Birth: [birth year] No data recorded  Encounter Date: 05/27/2019  PT End of Session - 05/27/19 0716    Visit Number  5    Number of Visits  12    Date for PT Re-Evaluation  06/11/19    Authorization Type  BCBS    PT Start Time  0713   pt late   PT Stop Time  0751    PT Time Calculation (min)  38 min    Activity Tolerance  Patient tolerated treatment well    Behavior During Therapy  [facility] for tasks assessed/performed       Past Medical History:  Diagnosis Date  . Hx of migraines   . Hyperaldosteronism (HCC)   . Sickle cell trait (HCC)   . Vaginal delivery 06/24/1993    Past Surgical History:  Procedure Laterality Date  . WISDOM TOOTH EXTRACTION Left     There were no vitals filed for this visit.  Subjective Assessment - 05/27/19 0718    Subjective  No pain just a headache    Currently in Pain?  No/denies                       [facility] Adult PT Treatment/Exercise - 05/27/19 0001      Knee/Hip Exercises: Aerobic   Nustep  L5  LE only  7 min      Knee/Hip Exercises: Standing   Forward Lunges  Right;Left;10 reps    Forward Step Up  Right;20 reps;Hand Hold: 1;Step Height: 8"    Step Down  Left;20 reps;Hand Hold: 1;Step Height: 8"    Step Down Limitations  Lift LT knee high    Functional Squat  20 reps    Functional Squat Limitations   from mat table    Rebounder  ball toss RT leg stand     Other Standing Knee Exercises  sidesteps with green band 15 ft x 4 RT/LT     Other Standing Knee Exercises  stairs laterlally RT and LT x 20 backward sttep x 20 RT foot up 6in step.      Bilateral foot pronation may impact single leg balance         PT Short Term Goals - 05/24/19 0829      PT SHORT TERM GOAL #1   Title  Shre  will be independent with initial HEP    Status  Achieved      PT SHORT TERM GOAL #3   Title  She will rewport 30% decr Pain    Status  Achieved        PT Long Term Goals - 05/27/19 0756      PT LONG TERM GOAL #1   Title  She will be independent witrh all HEP    Status  On-going      PT LONG TERM GOAL #2   Title  She will report 50% imperovement with decr pain    Status  Achieved      PT LONG TERM GOAL #3   Title  She will report abl to stand 2-3 hours without pain    Status  Achieved            Plan - 05/27/19 0716    Clinical Impression Statement  Noted decreased single leg balance RT  leg vcompared to LT. Suggested she work on this diring day when she is on feet and cued for weight to lateral foot and pulling hip in medial  for body alighnment.  She is doing well with all activity  without pain. Continue x 2-4 session . ADd lunges for HWP    PT Treatment/Interventions  Cryotherapy;Moist Heat;Ultrasound;Therapeutic activities;Therapeutic exercise;Manual techniques;Patient/family education;Passive range of motion    PT Next Visit Plan  Review HEP,  add lunges for home  continued closed chain exercises    PT Home Exercise Plan  bridge, SLR , Stretch ER , flexion , extension, Shoulder bridge, PPT   side clam and hip abduction    Consulted and Agree with Plan of Care  Patient       Patient will benefit from skilled therapeutic intervention in order to improve the following deficits and impairments:  Decreased range of motion, Pain, Postural dysfunction, Decreased strength, Decreased activity tolerance  Visit Diagnosis: Stiffness of right hip, not elsewhere classified  Pain in right hip     Problem List Patient Active Problem List   Diagnosis Date Noted  . Hematuria 01/19/2018  . Hyperaldosteronism ([place]) 09/05/2011  . Essential hypertension 09/05/2011  . Sickle cell trait ([place]) 09/05/2011  . Migraine without aura 07/24/2011    Darrel Hoover  PT 05/27/2019,  7:57 AM  [facility] [address] [place], [place], [address] Phone: [phone]   Fax:  [phone]  Name: Destiny Alvarez MRN: [id number] Date of Birth: December 07, 1965

## 2019-05-31 ENCOUNTER — Other Ambulatory Visit: Payer: Self-pay

## 2019-05-31 ENCOUNTER — Ambulatory Visit: Payer: BC Managed Care – PPO

## 2019-05-31 DIAGNOSIS — M25551 Pain in right hip: Secondary | ICD-10-CM

## 2019-05-31 DIAGNOSIS — M25651 Stiffness of right hip, not elsewhere classified: Secondary | ICD-10-CM

## 2019-05-31 NOTE — Patient Instructions (Signed)
Lunges RT /Lt  ,   sititi stand,   Planks on elbow, bird dog RT /LT x 10 daily   Gastroc stretch  X 2 RT LT 30 sec 2x/day

## 2019-05-31 NOTE — Therapy (Signed)
[facility] [address] [place], [place], [address] Phone: [phone]   Fax:  [phone]  Physical Therapy Treatment  Patient Details  Name: Destiny Alvarez MRN: [id number] Date of Birth: 11-20-1965 No data recorded  Encounter Date: 05/31/2019  PT End of Session - 05/31/19 0709    Visit Number  6    Number of Visits  12    Date for PT Re-Evaluation  06/11/19    Authorization Type  BCBS    PT Start Time  0709    PT Stop Time  0747    PT Time Calculation (min)  38 min    Activity Tolerance  Patient tolerated treatment well    Behavior During Therapy  [facility] for tasks assessed/performed       Past Medical History:  Diagnosis Date  . Hx of migraines   . Hyperaldosteronism (HCC)   . Sickle cell trait (HCC)   . Vaginal delivery 06/24/1993    Past Surgical History:  Procedure Laterality Date  . WISDOM TOOTH EXTRACTION Left     There were no vitals filed for this visit.  Subjective Assessment - 05/31/19 0709    Subjective  No pain . Doing well    Currently in Pain?  No/denies                       [facility] Adult PT Treatment/Exercise - 05/31/19 0001      Exercises   Exercises  Lumbar      Lumbar Exercises: Quadruped   Opposite Arm/Leg Raise  Right arm/Left leg;Left arm/Right leg;10 reps    Plank  5 reps on elbows      Knee/Hip Exercises: Aerobic   Nustep  L6  LE only  7 min      Knee/Hip Exercises: Standing   Forward Lunges  Right;Left;10 reps    Lateral Step Up  Right;Left;15 reps;Hand Hold: 1    Lateral Step Up Limitations  11 inch step.     Functional Squat  20 reps    Functional Squat Limitations   from mat table    Rebounder  ball toss RT leg stand        See HEP for others  Exercises done today       PT Education - 05/31/19 0756    Education Details  HEP    Person(s) Educated  Patient    Methods  Explanation;Demonstration;Tactile cues;Verbal cues;Handout    Comprehension   Verbalized understanding;Returned demonstration       PT Short Term Goals - 05/24/19 0829      PT SHORT TERM GOAL #1   Title  Shre will be independent with initial HEP    Status  Achieved      PT SHORT TERM GOAL #3   Title  She will rewport 30% decr Pain    Status  Achieved        PT Long Term Goals - 05/27/19 0756      PT LONG TERM GOAL #1   Title  She will be independent witrh all HEP    Status  On-going      PT LONG TERM GOAL #2   Title  She will report 50% imperovement with decr pain    Status  Achieved      PT LONG TERM GOAL #3   Title  She will report abl to stand 2-3 hours without pain    Status  Achieved  Plan - 05/31/19 0710    Clinical Impression Statement  No pain , doing well, able to do all exzercise limitwd by weakness .   Long LE also make incr lever arm also incr effort.   She should be ready for discharge next sesion with HEP for LE strength and stretching    PT Treatment/Interventions  Cryotherapy;Moist Heat;Ultrasound;Therapeutic activities;Therapeutic exercise;Manual techniques;Patient/family education;Passive range of motion    PT Next Visit Plan  Review HEP, ROM Discharge    PT Home Exercise Plan  bridge, SLR , Stretch ER , flexion , extension, Shoulder bridge, PPT   side clam and hip abduction, sit to stand , lunges , gastroc stretch, tandem walk,  plank on elbows , bird dog    Consulted and Agree with Plan of Care  Patient       Patient will benefit from skilled therapeutic intervention in order to improve the following deficits and impairments:  Decreased range of motion, Pain, Postural dysfunction, Decreased strength, Decreased activity tolerance  Visit Diagnosis: Stiffness of right hip, not elsewhere classified  Pain in right hip     Problem List Patient Active Problem List   Diagnosis Date Noted  . Hematuria 01/19/2018  . Hyperaldosteronism ([place]) 09/05/2011  . Essential hypertension 09/05/2011  . Sickle cell trait  ([place]) 09/05/2011  . Migraine without aura 07/24/2011    Darrel Hoover  PT 05/31/2019, 8:03 AM  [facility] [address] [place], [place], [address] Phone: [phone]   Fax:  [phone]  Name: Destiny Alvarez MRN: [id number] Date of Birth: [birth year]

## 2019-06-03 ENCOUNTER — Other Ambulatory Visit: Payer: Self-pay

## 2019-06-03 ENCOUNTER — Ambulatory Visit: Payer: BC Managed Care – PPO

## 2019-06-03 DIAGNOSIS — M25551 Pain in right hip: Secondary | ICD-10-CM

## 2019-06-03 DIAGNOSIS — M25651 Stiffness of right hip, not elsewhere classified: Secondary | ICD-10-CM

## 2019-06-03 NOTE — Patient Instructions (Signed)
Single leg hip hinge  RT /LT x 10  daily

## 2019-06-03 NOTE — Therapy (Signed)
[facility] [address] [place], [place], [address] Phone: [phone]   Fax:  [phone]  Physical Therapy Treatment  Patient Details  Name: Destiny Alvarez MRN: [id number] Date of Birth: [birth year] No data recorded  Encounter Date: 06/03/2019  PT End of Session - 06/03/19 0706    Visit Number  7    Number of Visits  12    Date for PT Re-Evaluation  06/11/19    Authorization Type  BCBS    PT Start Time  0705    PT Stop Time  0745    PT Time Calculation (min)  40 min    Activity Tolerance  Patient tolerated treatment well    Behavior During Therapy  [facility] for tasks assessed/performed       Past Medical History:  Diagnosis Date  . Hx of migraines   . Hyperaldosteronism (HCC)   . Sickle cell trait (HCC)   . Vaginal delivery 06/24/1993    Past Surgical History:  Procedure Laterality Date  . WISDOM TOOTH EXTRACTION Left     There were no vitals filed for this visit.  Subjective Assessment - 06/03/19 0710    Subjective  No pain . doing fine.    Currently in Pain?  No/denies         [facility] PT Assessment - 06/03/19 0001      Observation/Other Assessments   Focus on Therapeutic Outcomes (FOTO)   42%.  worse than at intake tough she has no pain and limitations now since start of PT                    [facility] Adult PT Treatment/Exercise - 06/03/19 0001      Lumbar Exercises: Prone   Opposite Arm/Leg Raise  --    Opposite Arm/Leg Raise Limitations  green band    Other Prone Lumbar Exercises  Plank x 5 10 sec      Lumbar Exercises: Quadruped   Opposite Arm/Leg Raise  Right arm/Left leg;Left arm/Right leg;10 reps    Opposite Arm/Leg Raise Limitations  green band      Knee/Hip Exercises: Aerobic   Nustep  L6  LE only  7 min      Knee/Hip Exercises: Standing   Functional Squat  10 reps    Functional Squat Limitations  hold to fee motion    Other Standing Knee Exercises  single leg hip hinge RT and LT x  10 needed cuing for alighnment      Knee/Hip Exercises: Supine   Bridges  Both;10 reps    Bridges Limitations  x 10 cued to keep abdominals set and pelvic tilt    Single Leg Bridge  Right;Left;10 reps       Lunge x 10 RT/LT       PT Education - 06/03/19 0758    Education Details  HEP  single leg hip hinge    Person(s) Educated  Patient    Methods  Explanation;Demonstration;Tactile cues;Verbal cues;Handout    Comprehension  Verbalized understanding;Returned demonstration       PT Short Term Goals - 05/24/19 0829      PT SHORT TERM GOAL #1   Title  Shre will be independent with initial HEP    Status  Achieved      PT SHORT TERM GOAL #3   Title  She will rewport 30% decr Pain    Status  Achieved        PT Long Term Goals - 05/27/19  76      PT LONG TERM GOAL #1   Title  She will be independent witrh all HEP    Status  On-going      PT LONG TERM GOAL #2   Title  She will report 50% imperovement with decr pain    Status  Achieved      PT LONG TERM GOAL #3   Title  She will report abl to stand 2-3 hours without pain    Status  Achieved            Plan - 06/03/19 0706    Clinical Impression Statement  No pain.  No pain since start ot PT.  No functional limitations.  FOTO score worse.     No pain iwth exercises.    PT Treatment/Interventions  Cryotherapy;Moist Heat;Ultrasound;Therapeutic activities;Therapeutic exercise;Manual techniques;Patient/family education;Passive range of motion    PT Home Exercise Plan  bridge, SLR , Stretch ER , flexion , extension, Shoulder bridge, PPT   side clam and hip abduction, sit to stand , lunges , gastroc stretch, tandem walk,  plank on elbows , bird dog    Consulted and Agree with Plan of Care  Patient       Patient will benefit from skilled therapeutic intervention in order to improve the following deficits and impairments:  Decreased range of motion, Pain, Postural dysfunction, Decreased strength, Decreased activity  tolerance  Visit Diagnosis: Pain in right hip  Stiffness of right hip, not elsewhere classified     Problem List Patient Active Problem List   Diagnosis Date Noted  . Hematuria 01/19/2018  . Hyperaldosteronism ([place]) 09/05/2011  . Essential hypertension 09/05/2011  . Sickle cell trait ([place]) 09/05/2011  . Migraine without aura 07/24/2011    Destiny Alvarez  PT 06/03/2019, 7:59 AM  [facility] [address] [place], [place], [address] Phone: [phone]   Fax:  [phone]  Name: Destiny Alvarez MRN: [id number] Date of Birth: March 28, 1965

## 2019-06-14 ENCOUNTER — Other Ambulatory Visit: Payer: Self-pay

## 2019-06-14 ENCOUNTER — Ambulatory Visit: Payer: BC Managed Care – PPO

## 2019-06-14 DIAGNOSIS — M25651 Stiffness of right hip, not elsewhere classified: Secondary | ICD-10-CM

## 2019-06-14 DIAGNOSIS — M25551 Pain in right hip: Secondary | ICD-10-CM

## 2019-06-14 NOTE — Therapy (Signed)
[place], [place], [address] Phone: [phone]   Fax:  [phone]  Physical Therapy Treatment/Discharge  Patient Details  Name: Destiny Alvarez MRN: [id number] Date of Birth: [birth year] No data recorded  Encounter Date: 06/14/2019  PT End of Session - 06/14/19 0706    Visit Number  8    Date for PT Re-Evaluation  06/11/19    Authorization Type  BCBS    PT Start Time  0705    PT Stop Time  0745    PT Time Calculation (min)  40 min    Activity Tolerance  Patient tolerated treatment well    Behavior During Therapy  [facility] for tasks assessed/performed       Past Medical History:  Diagnosis Date  . Hx of migraines   . Hyperaldosteronism ([place])   . Sickle cell trait ([place])   . Vaginal delivery 06/24/1993    Past Surgical History:  Procedure Laterality Date  . WISDOM TOOTH EXTRACTION Left     There were no vitals filed for this visit.  Subjective Assessment - 06/14/19 0707    Subjective  No pain . doing fine.    Currently in Pain?  No/denies                        [facility] Adult PT Treatment/Exercise - 06/14/19 0001      Lumbar Exercises: Quadruped   Opposite Arm/Leg Raise  Right arm/Left leg;Left arm/Right leg;10 reps    Opposite Arm/Leg Raise Limitations  green band    Plank  5 reps on elbows      Knee/Hip Exercises: Stretches   Other Knee/Hip Stretches  Fig 4 , knee to chest, hip flexor ( done in step stand with hip hinge slide foot on floor)  1- 30 sec stretch        Knee/Hip Exercises: Aerobic   Nustep  L6  LE only  7 min      Knee/Hip Exercises: Standing   Forward Lunges  Right;Left;10 reps    Functional Squat  10 reps    Functional Squat Limitations  hold to free motion    Other Standing Knee Exercises  single leg hip hinge RT and LT x 10 needed cuing for alighnment. Variations to emphasize hip extension and lunge with sliding foot on towel P( to also help with level  pelvis)      Knee/Hip Exercises: Supine   Bridges  Both;10 reps    Single Leg Bridge  Right;Left;10 reps      Knee/Hip Exercises: Prone   Other Prone Exercises  plank on elbows x 5 0 sec hold               PT Short Term Goals - 05/24/19 0829      PT SHORT TERM GOAL #1   Title  Shre will be independent with initial HEP    Status  Achieved      PT SHORT TERM GOAL #3   Title  She will rewport 30% decr Pain    Status  Achieved        PT Long Term Goals - 05/27/19 0756      PT LONG TERM GOAL #1   Title  She will be independent witrh all HEP    Status  On-going      PT LONG TERM GOAL #2   Title  She will report 50% imperovement with decr pain    Status  Achieved      PT LONG TERM GOAL #3   Title  She will report abl to stand 2-3 hours without pain    Status  Achieved            Plan - 06/14/19 0706    Clinical Impression Statement  No pain . Able to perform HEP correctly. Ready for discharge.    PT Treatment/Interventions  Cryotherapy;Moist Heat;Ultrasound;Therapeutic activities;Therapeutic exercise;Manual techniques;Patient/family education;Passive range of motion    PT Next Visit Plan  Discharge with HEP    PT Home Exercise Plan  bridge, SLR , Stretch ER , flexion , extension, Shoulder bridge, PPT   side clam and hip abduction, sit to stand , lunges , gastroc stretch, tandem walk,  plank on elbows , bird dog    Consulted and Agree with Plan of Care  Patient       Patient will benefit from skilled therapeutic intervention in order to improve the following deficits and impairments:  Decreased range of motion, Pain, Postural dysfunction, Decreased strength, Decreased activity tolerance  Visit Diagnosis: Pain in right hip  Stiffness of right hip, not elsewhere classified     Problem List Patient Active Problem List   Diagnosis Date Noted  . Hematuria 01/19/2018  . Hyperaldosteronism ([place]) 09/05/2011  . Essential hypertension 09/05/2011  . Sickle  cell trait ([place]) 09/05/2011  . Migraine without aura 07/24/2011    Darrel Hoover  PT 06/14/2019, 7:53 AM  [facility] [address] [place], [place], [address] Phone: [phone]   Fax:  [phone]  Name: Destiny Alvarez MRN: [id number] Date of Birth: 1966/01/15  PHYSICAL THERAPY DISCHARGE SUMMARY  Visits from Start of Care: 8  Current functional level related to goals / functional outcomes: Se above. No pain or limits   Remaining deficits: None except not able to squat fully. Not due to pain   Education / Equipment: HEP Plan: Patient agrees to discharge.  Patient goals were met. Patient is being discharged due to meeting the stated rehab goals.  ?????

## 2019-12-03 ENCOUNTER — Other Ambulatory Visit: Payer: Self-pay

## 2019-12-03 ENCOUNTER — Encounter: Payer: Self-pay | Admitting: Physician Assistant

## 2019-12-03 ENCOUNTER — Telehealth (INDEPENDENT_AMBULATORY_CARE_PROVIDER_SITE_OTHER): Payer: HRSA Program | Admitting: Physician Assistant

## 2019-12-03 VITALS — Ht 63.0 in | Wt 250.0 lb

## 2019-12-03 DIAGNOSIS — U071 COVID-19: Secondary | ICD-10-CM | POA: Diagnosis not present

## 2019-12-03 NOTE — Progress Notes (Signed)
Virtual Visit via Video   I connected with Destiny Alvarez on 12/03/19 at 12:00 PM EST by a video enabled telemedicine application and verified that I am speaking with the correct person using two identifiers. Location patient: Home Location provider: [facility], Office Persons participating in the virtual visit: Carie Saloni, [place] PA-C, Corky Mull, LPN   I discussed the limitations of evaluation and management by telemedicine and the availability of in person appointments. The patient expressed understanding and agreed to proceed.  I acted as a Neurosurgeon for [employer], PA-C [employer], LPN   Subjective:   HPI:   Patient is currently COVID positive. She is unvaccinated.  Symptoms started last Tuesday 11/2, while she was in [place]. She went to the ER in [place] on 11/25/19. She tested positive for COVID.  Yesterday morning felt like she was improving. Last night had significant diaphoresis. Reports that labored and "uncontrolled" breathing last night. She woke up this morning and was significant nauseous. Has severe headache. She hasn't been able to eat much. Doesn't have a pulse oximeter. Was given a 5-day dosage.   CXR in ER: Questionable pneumonia best seen in the sagittal view. Follow-up to resolution recommended to exclude underlying mass.  Travel/contacts: [place], sister and mother  Vaccination status: No  Patient endorses the following symptoms: sinus headache, sinus congestion, productive cough (pale green), wheezing, chest tightness and myalgia  Patient denies the following symptoms: Fever (no), ear pain and sore throat  Treatments tried: Doxycycline, Tessalon capsules  Patient risk factors: Current COVID-19 risk of complications score: 2 Smoking status: Destiny Alvarez  reports that she has never smoked. She has never used smokeless tobacco. If female, currently pregnant? []   Yes [x]   No  ROS: See pertinent positives and negatives  per HPI.  Patient Active Problem List   Diagnosis Date Noted  . Hematuria 01/19/2018  . Hyperaldosteronism (HCC) 09/05/2011  . Essential hypertension 09/05/2011  . Sickle cell trait (HCC) 09/05/2011  . Migraine without aura 07/24/2011    Social History   Tobacco Use  . Smoking status: Never Smoker  . Smokeless tobacco: Never Used  Substance Use Topics  . Alcohol use: Never    Current Outpatient Medications:  .  amLODipine (NORVASC) 10 MG tablet, Take 1 tablet (10 mg total) by mouth once daily, Disp: , Rfl:  .  benzonatate (TESSALON) 100 MG capsule, Take 100 mg by mouth 3 (three) times daily., Disp: , Rfl:  .  doxycycline (VIBRAMYCIN) 100 MG capsule, Take 100 mg by mouth 2 (two) times daily., Disp: , Rfl:  .  eplerenone (INSPRA) 50 MG tablet, Take 50 mg by mouth 2 (two) times daily., Disp: , Rfl:  .  ferrous sulfate 325 (65 FE) MG tablet, Take 325 mg by mouth daily., Disp: , Rfl:  .  labetalol (NORMODYNE) 100 MG tablet, TAKE 1 AND 1/2 TABLETS BY MOUTH TWICE A DAY, Disp: , Rfl:  .  Multiple Vitamin (MULTIVITAMIN) capsule, 1 daily, Disp: , Rfl:   Allergies  Allergen Reactions  . Losartan Cough    Objective:   VITALS: Per patient if applicable, see vitals. GENERAL: Alert, appears well and in no acute distress. HEENT: Atraumatic, conjunctiva clear, no obvious abnormalities on inspection of external nose and ears. NECK: Normal movements of the head and neck. CARDIOPULMONARY: No increased WOB. Speaking in clear sentences. I:E ratio WNL.  MS: Moves all visible extremities without noticeable abnormality. PSYCH: Pleasant and cooperative, well-groomed. Speech normal rate and rhythm. Affect is appropriate.  Insight and judgement are appropriate. Attention is focused, linear, and appropriate.  NEURO: CN grossly intact. Oriented as arrived to appointment on time with no prompting. Moves both UE equally.  SKIN: No obvious lesions, wounds, erythema, or cyanosis noted on face or  hands.  Assessment and Plan:   Destiny Alvarez was seen today for covid positive.  Diagnoses and all orders for this visit:  COVID-19   Due to severe fatigue, profound weakness, and recent COVID diagnosis, recommend that she immediately proceed to the ER for further evaluation.  I discussed the assessment and treatment plan with the patient. The patient was provided an opportunity to ask questions and all were answered. The patient agreed with the plan and demonstrated an understanding of the instructions.   CMA or LPN served as scribe during this visit. History, Physical, and Plan performed by medical provider. The above documentation has been reviewed and is accurate and complete.  [place], [name] 12/03/2019

## 2019-12-27 ENCOUNTER — Encounter: Payer: Self-pay | Admitting: Physician Assistant

## 2019-12-27 ENCOUNTER — Telehealth (INDEPENDENT_AMBULATORY_CARE_PROVIDER_SITE_OTHER): Payer: HRSA Program | Admitting: Physician Assistant

## 2019-12-27 ENCOUNTER — Other Ambulatory Visit: Payer: Self-pay

## 2019-12-27 DIAGNOSIS — U071 COVID-19: Secondary | ICD-10-CM

## 2019-12-27 MED ORDER — BENZONATATE 100 MG PO CAPS: 100.0000 mg | ORAL_CAPSULE | Freq: Three times a day (TID) | ORAL | 1 refills | Status: AC

## 2019-12-27 MED ORDER — ALBUTEROL SULFATE HFA 108 (90 BASE) MCG/ACT IN AERS: 2.0000 | INHALATION_SPRAY | Freq: Four times a day (QID) | RESPIRATORY_TRACT | 0 refills | Status: AC | PRN

## 2019-12-27 NOTE — Progress Notes (Signed)
Virtual Visit via Video   I connected with Destiny Alvarez on 12/27/19 at 12:00 PM EST by a video enabled telemedicine application and verified that I am speaking with the correct person using two identifiers. Location patient: Home Location provider: [facility] Persons participating in the virtual visit: Maisyn Tinesha, [place] PA-C  I discussed the limitations of evaluation and management by telemedicine and the availability of in person appointments. The patient expressed understanding and agreed to proceed.   Subjective:   HPI:   She is following up on COVID-19 today. Positive COVID test on 11/25/19.  Virtual visit with me on 12/03/19 and was recommended to go to the ER. She states that she was too fatigued to go to the ER so she just stayed home. She did take the oral doxycycline as prescribed.  Symptoms have overall improved but she is having PND, nasal congestion, slight SOB  Denies: fevers, chills, nausea, chest pain, chest tightness, lightheadedness  Patient risk factors: Current COVID-19 risk of complications score: 2 Smoking status: Destiny Alvarez  reports that she has never smoked. She has never used smokeless tobacco. If female, currently pregnant? []   Yes [x]   No  ROS: See pertinent positives and negatives per HPI.  Patient Active Problem List   Diagnosis Date Noted  . Hematuria 01/19/2018  . Hyperaldosteronism (HCC) 09/05/2011  . Essential hypertension 09/05/2011  . Sickle cell trait (HCC) 09/05/2011  . Migraine without aura 07/24/2011    Social History   Tobacco Use  . Smoking status: Never Smoker  . Smokeless tobacco: Never Used  Substance Use Topics  . Alcohol use: Never    Current Outpatient Medications:  .  amLODipine (NORVASC) 10 MG tablet, Take 1 tablet (10 mg total) by mouth once daily, Disp: , Rfl:  .  benzonatate (TESSALON) 100 MG capsule, Take 1 capsule (100 mg total) by mouth 3 (three) times daily., Disp: 30 capsule, Rfl: 1 .   ECHINACEA PO, Take 150 mg by mouth. The multi vitamin, Disp: , Rfl:  .  eplerenone (INSPRA) 50 MG tablet, Take 50 mg by mouth 2 (two) times daily., Disp: , Rfl:  .  ferrous sulfate 325 (65 FE) MG tablet, Take 325 mg by mouth daily., Disp: , Rfl:  .  labetalol (NORMODYNE) 100 MG tablet, TAKE 1 AND 1/2 TABLETS BY MOUTH TWICE A DAY, Disp: , Rfl:  .  Multiple Vitamin (MULTIVITAMIN) capsule, 1 daily, Disp: , Rfl:  .  Multiple Vitamins-Minerals (ZINC PO), Take by mouth., Disp: , Rfl:  .  VITAMIN D PO, Take by mouth., Disp: , Rfl:  .  albuterol (VENTOLIN HFA) 108 (90 Base) MCG/ACT inhaler, Inhale 2 puffs into the lungs every 6 (six) hours as needed for wheezing or shortness of breath., Disp: 8 g, Rfl: 0  Allergies  Allergen Reactions  . Losartan Cough    Objective:   VITALS: Per patient if applicable, see vitals. GENERAL: Alert, appears well and in no acute distress. HEENT: Atraumatic, conjunctiva clear, no obvious abnormalities on inspection of external nose and ears. NECK: Normal movements of the head and neck. CARDIOPULMONARY: No increased WOB. Speaking in clear sentences. I:E ratio WNL.  MS: Moves all visible extremities without noticeable abnormality. PSYCH: Pleasant and cooperative, well-groomed. Speech normal rate and rhythm. Affect is appropriate. Insight and judgement are appropriate. Attention is focused, linear, and appropriate.  NEURO: CN grossly intact. Oriented as arrived to appointment on time with no prompting. Moves both UE equally.  SKIN: No obvious lesions, wounds,  erythema, or cyanosis noted on face or hands.  Assessment and Plan:   Curstin was seen today for covid 19 follow up.  Diagnoses and all orders for this visit:  COVID-19 No red flags on discussion, patient is not in any obvious distress during our visit. Discussed progression of most viral illness, and recommended supportive care at this point in time. I do not think she needs to repeat another round of oral abx  at this time. Recommend tessalon perles as these were helpful for her. Albuterol inhaler prn (good rx is around $20.) We considered ICS but due to lack of insurance I do not think this is an affordable option at this time, however if she requires frequent use of albuterol, will trial ICS. Worsening precautions advised in the interim.  Other orders -     benzonatate (TESSALON) 100 MG capsule; Take 1 capsule (100 mg total) by mouth 3 (three) times daily. -     albuterol (VENTOLIN HFA) 108 (90 Base) MCG/ACT inhaler; Inhale 2 puffs into the lungs every 6 (six) hours as needed for wheezing or shortness of breath.   I discussed the assessment and treatment plan with the patient. The patient was provided an opportunity to ask questions and all were answered. The patient agreed with the plan and demonstrated an understanding of the instructions.   The patient was advised to call back or seek an in-person evaluation if the symptoms worsen or if the condition fails to improve as anticipated.    [name], [name] 12/27/2019

## 2021-12-03 IMAGING — DX DG HIP (WITH OR WITHOUT PELVIS) 2-3V*R*
4 series · 4 of 4 positions shown · non-contrast
Comparison: None.

CLINICAL DATA: Pain

EXAM:
DG HIP (WITH OR WITHOUT PELVIS) 2-3V RIGHT

[pelvis ap (1 of 2)]
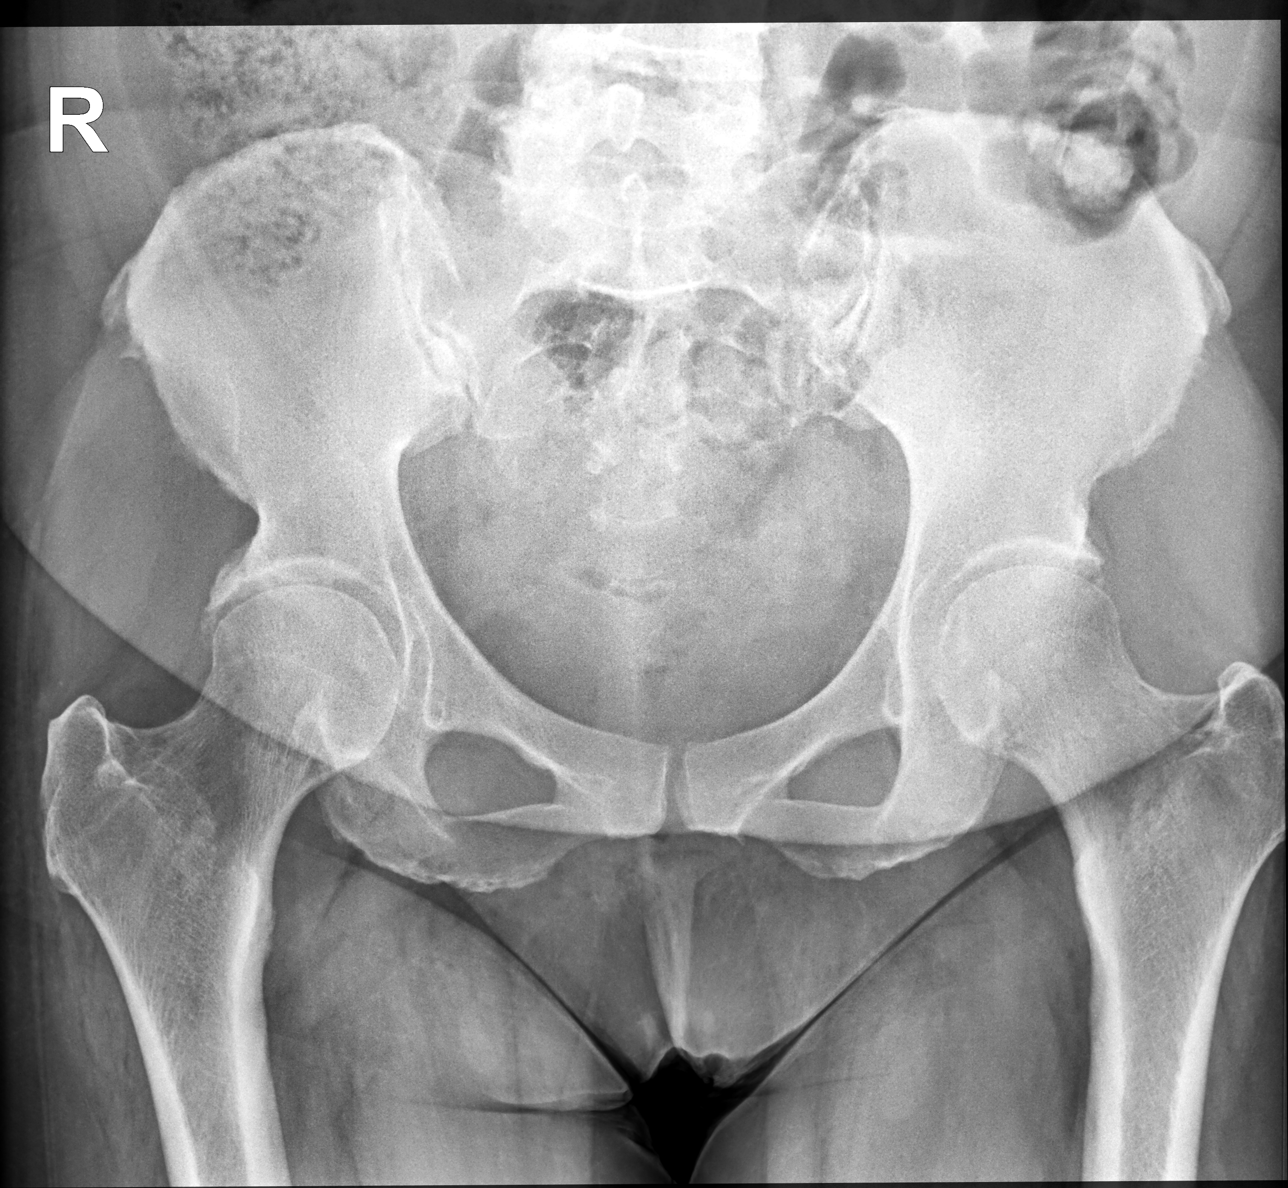

[pelvis ap (2 of 2)]
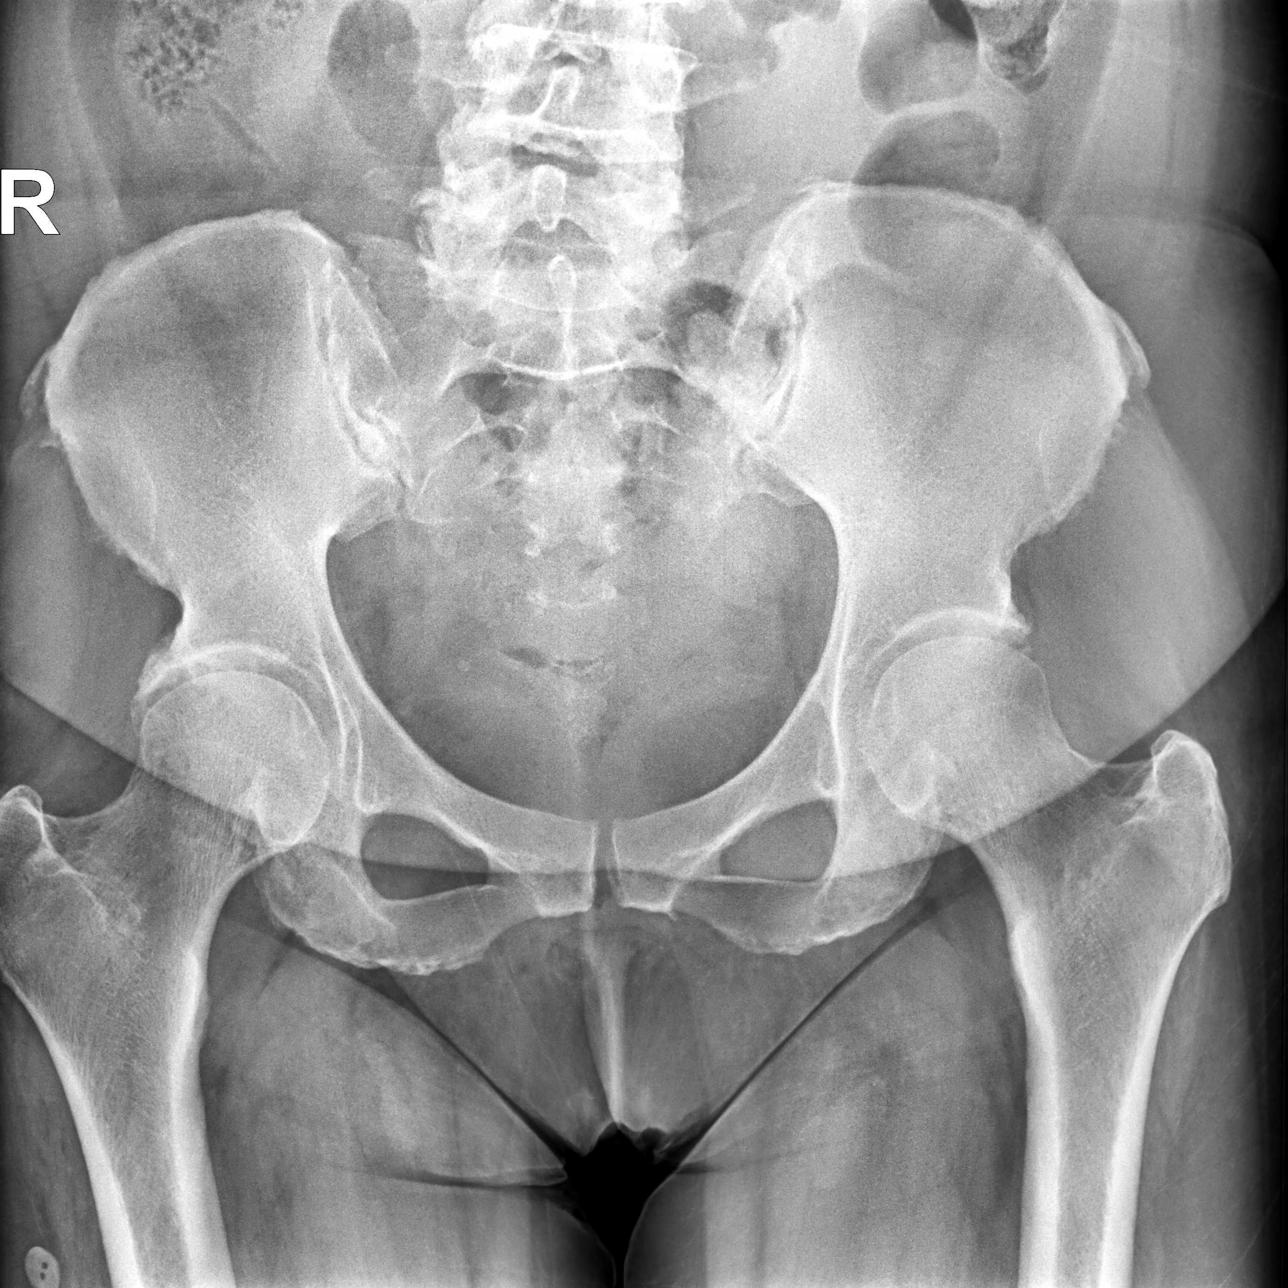

[hip joint ap]
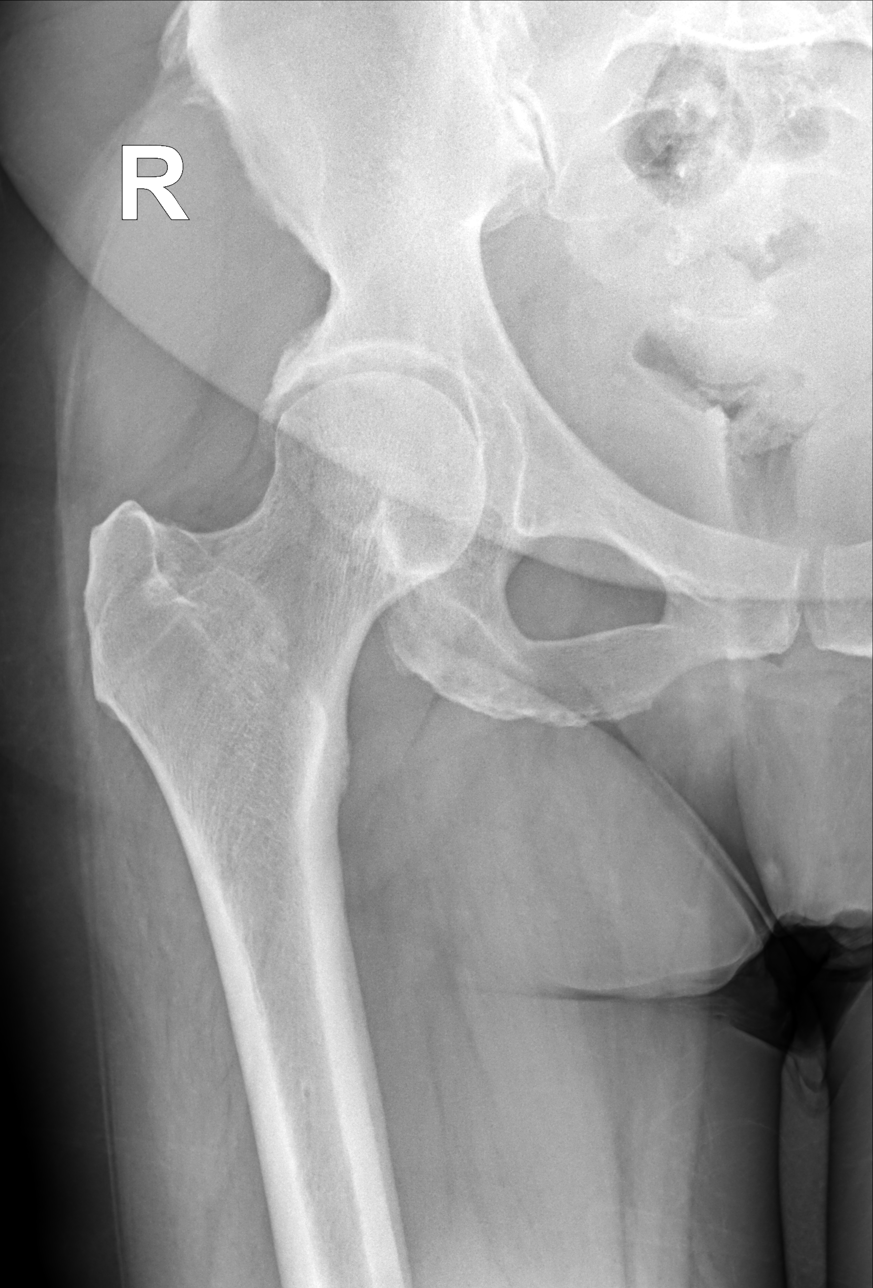

[hip joint (frog view)]
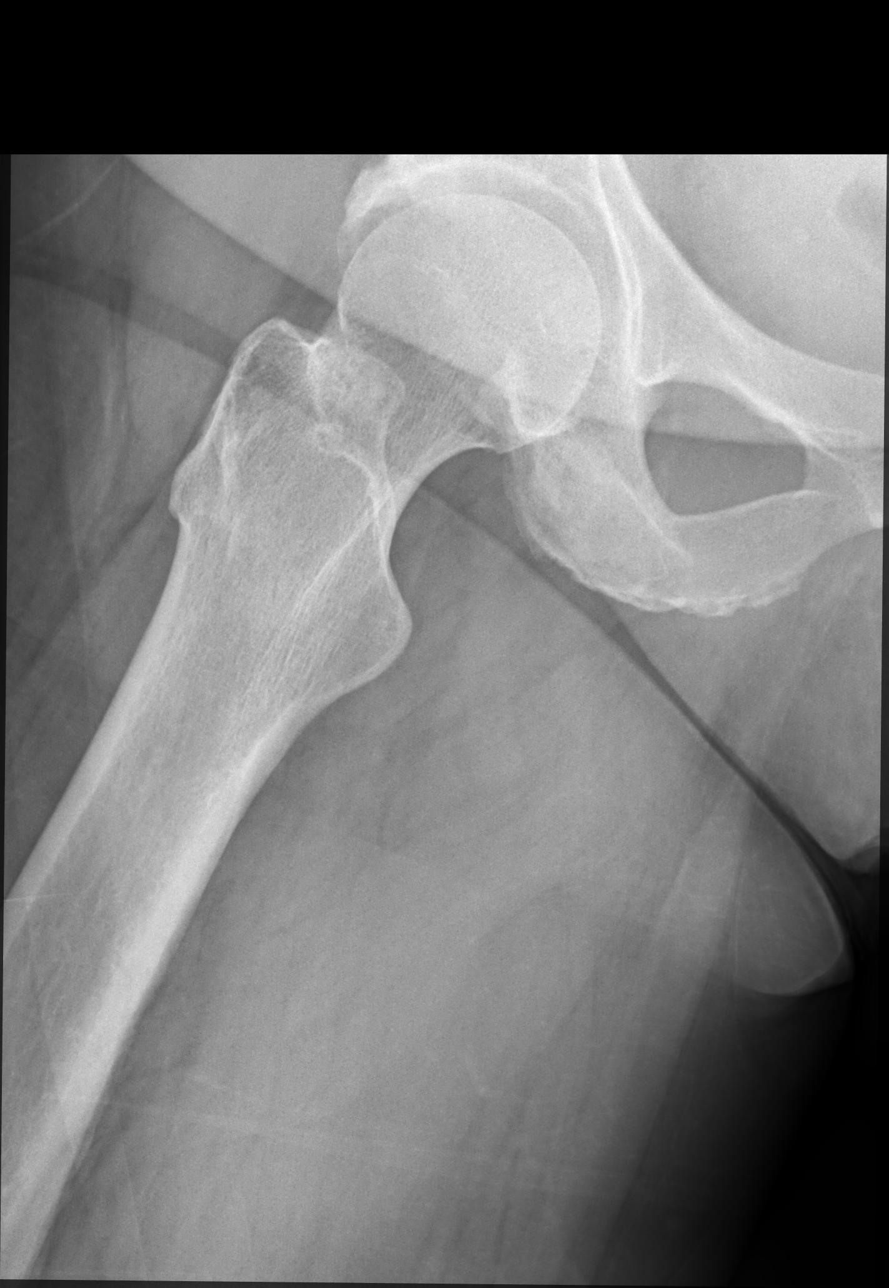

[4 of 4 positions shown; findings below may reference images not displayed]

FINDINGS: Frontal pelvis as well as frontal and lateral right hip images were
obtained. No fracture or dislocation. There is minimal symmetric
narrowing of each hip joint. Sacroiliac joints appear normal. No
erosive change.

There is bony overgrowth along each superolateral acetabulum, more
severe on the right than on the left.
IMPRESSION: Bony overgrowth along each superolateral acetabulum, more severe on
the right than on the left. This finding places patient at increased
risk for potential femoroacetabular impingement.

Minimal symmetric narrowing of each hip joint. No fracture or
dislocation.
